# Patient Record
Sex: Female | Born: 1995 | Race: Black or African American | Hispanic: No | Marital: Single | Smoking: Current every day smoker
Health system: Southern US, Community
[De-identification: ages and names within clinical notes are randomized; demographics above are authoritative.]

## PROBLEM LIST (undated history)

## (undated) DIAGNOSIS — J45909 Unspecified asthma, uncomplicated: Secondary | ICD-10-CM

## (undated) DIAGNOSIS — I10 Essential (primary) hypertension: Secondary | ICD-10-CM

## (undated) DIAGNOSIS — D539 Nutritional anemia, unspecified: Secondary | ICD-10-CM

## (undated) DIAGNOSIS — F1990 Other psychoactive substance use, unspecified, uncomplicated: Secondary | ICD-10-CM

## (undated) DIAGNOSIS — K859 Acute pancreatitis without necrosis or infection, unspecified: Secondary | ICD-10-CM

## (undated) HISTORY — DX: Other psychoactive substance use, unspecified, uncomplicated: F19.90

## (undated) HISTORY — PX: NO PAST SURGERIES: SHX2092

---
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
## 2014-01-26 ENCOUNTER — Emergency Department: Payer: Self-pay | Admitting: Emergency Medicine

## 2014-01-26 LAB — COMPREHENSIVE METABOLIC PANEL
ALBUMIN: 4.1 g/dL (ref 3.8–5.6)
ANION GAP: 7 (ref 7–16)
AST: 9 U/L (ref 0–26)
Alkaline Phosphatase: 51 U/L
BILIRUBIN TOTAL: 0.2 mg/dL (ref 0.2–1.0)
BUN: 10 mg/dL (ref 7–18)
CREATININE: 0.88 mg/dL (ref 0.60–1.30)
Calcium, Total: 9.7 mg/dL (ref 9.0–10.7)
Chloride: 106 mmol/L (ref 98–107)
Co2: 23 mmol/L (ref 21–32)
EGFR (African American): >60
Glucose: 96 mg/dL (ref 65–99)
OSMOLALITY: 271 (ref 275–301)
POTASSIUM: 3.7 mmol/L (ref 3.5–5.1)
SGPT (ALT): 16 U/L
Sodium: 136 mmol/L (ref 136–145)
Total Protein: 9.4 g/dL — ABNORMAL HIGH (ref 6.4–8.6)

## 2014-01-26 LAB — URINALYSIS, COMPLETE
Bilirubin,UR: NEGATIVE
Glucose,UR: NEGATIVE mg/dL (ref 0–75)
Ketone: NEGATIVE
LEUKOCYTE ESTERASE: NEGATIVE
Nitrite: NEGATIVE
PH: 5 (ref 4.5–8.0)
Protein: 30
RBC,UR: 2 /HPF (ref 0–5)
Specific Gravity: 1.029 (ref 1.003–1.030)

## 2014-01-26 LAB — CBC WITH DIFFERENTIAL/PLATELET
BASOS ABS: 0.1 10*3/uL (ref 0.0–0.1)
Basophil %: 0.3 %
Eosinophil #: 0.1 10*3/uL (ref 0.0–0.7)
Eosinophil %: 0.4 %
HCT: 49.6 % — ABNORMAL HIGH (ref 35.0–47.0)
HGB: 16.1 g/dL — AB (ref 12.0–16.0)
Lymphocyte #: 1.1 10*3/uL (ref 1.0–3.6)
Lymphocyte %: 6.2 %
MCH: 29.1 pg (ref 26.0–34.0)
MCHC: 32.4 g/dL (ref 32.0–36.0)
MCV: 90 fL (ref 80–100)
MONO ABS: 1 x10 3/mm — AB (ref 0.2–0.9)
MONOS PCT: 6 %
NEUTROS ABS: 14.9 10*3/uL — AB (ref 1.4–6.5)
Neutrophil %: 87.1 %
Platelet: 323 10*3/uL (ref 150–440)
RBC: 5.52 10*6/uL — ABNORMAL HIGH (ref 3.80–5.20)
RDW: 12.8 % (ref 11.5–14.5)
WBC: 17.1 10*3/uL — AB (ref 3.6–11.0)

## 2014-01-26 LAB — LIPASE, BLOOD: Lipase: 103 U/L (ref 73–393)

## 2014-07-29 ENCOUNTER — Emergency Department: Payer: Self-pay | Admitting: Emergency Medicine

## 2014-09-30 NOTE — H&P (Signed)
L&D Evaluation:  History:  HPI [age] y/o G1 2940w3d   Presents with contractions   Patient's Medical History No Chronic Illness   Medications Pre Natal Vitamins   Allergies NKDA   Social History none   Family History Non-Contributory   ROS:  ROS All systems were reviewed.  HEENT, CNS, GI, GU, Respiratory, CV, Renal and Musculoskeletal systems were found to be normal.   Exam:  General no apparent distress   Abdomen gravid, tender with contractions   Estimated Fetal Weight Average for gestational age   Pelvic 4->5 cm   FHT occ'l variable   Ucx regular   Impression:  Impression active labor   Plan:  Comments Admit for GBS prophylaxis and efm and expectant mgt   Electronic Signatures: Margaretha GlassingEvans, Ricky L (MD)  (Signed 01-Apr-14 12:06)  Authored: L&D Evaluation   Last Updated: 01-Apr-14 12:06 by Margaretha GlassingEvans, Ricky L (MD)

## 2015-01-20 ENCOUNTER — Encounter: Payer: Self-pay | Admitting: Emergency Medicine

## 2015-01-20 ENCOUNTER — Emergency Department
Admission: EM | Admit: 2015-01-20 | Discharge: 2015-01-20 | Disposition: A | Discharge status: Home / Self Care | Payer: Medicaid Other | Attending: Student | Admitting: Student

## 2015-01-20 DIAGNOSIS — R509 Fever, unspecified: Secondary | ICD-10-CM | POA: Diagnosis present

## 2015-01-20 DIAGNOSIS — R197 Diarrhea, unspecified: Secondary | ICD-10-CM | POA: Insufficient documentation

## 2015-01-20 DIAGNOSIS — Z3202 Encounter for pregnancy test, result negative: Secondary | ICD-10-CM | POA: Insufficient documentation

## 2015-01-20 DIAGNOSIS — R1030 Lower abdominal pain, unspecified: Secondary | ICD-10-CM | POA: Diagnosis not present

## 2015-01-20 DIAGNOSIS — J069 Acute upper respiratory infection, unspecified: Secondary | ICD-10-CM | POA: Diagnosis not present

## 2015-01-20 LAB — CBC WITH DIFFERENTIAL/PLATELET
Basophils Absolute: 0 10*3/uL (ref 0–0.1)
Basophils Relative: 0 %
EOS PCT: 1 %
Eosinophils Absolute: 0.1 10*3/uL (ref 0–0.7)
HCT: 42.4 % (ref 35.0–47.0)
Hemoglobin: 13.8 g/dL (ref 12.0–16.0)
LYMPHS ABS: 1.5 10*3/uL (ref 1.0–3.6)
LYMPHS PCT: 14 %
MCH: 28.8 pg (ref 26.0–34.0)
MCHC: 32.6 g/dL (ref 32.0–36.0)
MCV: 88.5 fL (ref 80.0–100.0)
MONO ABS: 1 10*3/uL — AB (ref 0.2–0.9)
Monocytes Relative: 9 %
Neutro Abs: 8.5 10*3/uL — ABNORMAL HIGH (ref 1.4–6.5)
Neutrophils Relative %: 76 %
PLATELETS: 290 10*3/uL (ref 150–440)
RBC: 4.79 MIL/uL (ref 3.80–5.20)
RDW: 12.8 % (ref 11.5–14.5)
WBC: 11.1 10*3/uL — ABNORMAL HIGH (ref 3.6–11.0)

## 2015-01-20 LAB — URINALYSIS COMPLETE WITH MICROSCOPIC (ARMC ONLY)
BILIRUBIN URINE: NEGATIVE
GLUCOSE, UA: NEGATIVE mg/dL
Hgb urine dipstick: NEGATIVE
KETONES UR: NEGATIVE mg/dL
Leukocytes, UA: NEGATIVE
Nitrite: NEGATIVE
Protein, ur: NEGATIVE mg/dL
RBC / HPF: NONE SEEN RBC/hpf (ref 0–5)
SPECIFIC GRAVITY, URINE: 1.008 (ref 1.005–1.030)
WBC, UA: NONE SEEN WBC/hpf (ref 0–5)
pH: 7 (ref 5.0–8.0)

## 2015-01-20 MED ORDER — PSEUDOEPHEDRINE HCL 60 MG PO TABS: 60.0000 mg | ORAL_TABLET | ORAL | Status: DC | PRN

## 2015-01-20 MED ORDER — AZITHROMYCIN 250 MG PO TABS: ORAL_TABLET | ORAL | Status: DC

## 2015-01-20 MED ORDER — CHLORPHENIRAMINE MALEATE 4 MG PO TABS: 4.0000 mg | ORAL_TABLET | Freq: Two times a day (BID) | ORAL | Status: DC | PRN

## 2015-01-20 MED ORDER — GUAIFENESIN-CODEINE 100-10 MG/5ML PO SOLN: 10.0000 mL | ORAL | Status: DC | PRN

## 2015-01-20 NOTE — ED Provider Notes (Signed)
[facility] Emergency Department Provider Note  ____________________________________________  Time seen: Approximately 2:45 PM  I have reviewed the triage vital signs and the nursing notes.   HISTORY  Chief Complaint Sore Throat and Facial Pain    HPI Courtney Swanson is a 19 y.o. female presents with complaints of bodyaches chills sore throat fever last 3 days. Patient states loose bowels just today. Decreased appetite. No relief with over-the-counter TheraFlu.   History reviewed. No pertinent past medical history.  There are no active problems to display for this patient.   History reviewed. No pertinent past surgical history.  Current Outpatient Rx  Name  Route  Sig  Dispense  Refill  . azithromycin (ZITHROMAX Z-PAK) 250 MG tablet      Take 2 tablets (500 mg) on  Day 1,  followed by 1 tablet (250 mg) once daily on Days 2 through 5.   6 each   0   . chlorpheniramine (CHLOR-TRIMETON) 4 MG tablet   Oral   Take 1 tablet (4 mg total) by mouth 2 (two) times daily as needed for allergies or rhinitis.   30 tablet   0   . guaiFENesin-codeine 100-10 MG/5ML syrup   Oral   Take 10 mLs by mouth every 4 (four) hours as needed for cough.   180 mL   0   . pseudoephedrine (SUDAFED) 60 MG tablet   Oral   Take 1 tablet (60 mg total) by mouth every 4 (four) hours as needed for congestion.   24 tablet   0     Allergies Review of patient's allergies indicates no known allergies.  No family history on file.  Social History Social History  Substance Use Topics  . Smoking status: Never Smoker   . Smokeless tobacco: None  . Alcohol Use: No    Review of Systems Constitutional: Positive fever and chills Eyes: No visual changes. ENT: No sore throat. Cardiovascular: Denies chest pain. Respiratory: Positive for cough Gastrointestinal: Positive for lower abdominal pain and some diarrhea. Denies any vomiting nausea or constipation. Genitourinary:  Negative for dysuria. Musculoskeletal: Negative for back pain. Skin: Negative for rash. Neurological: Negative for headaches, focal weakness or numbness.  10-point ROS otherwise negative.  ____________________________________________   PHYSICAL EXAM:  VITAL SIGNS: ED Triage Vitals  Enc Vitals Group     BP 01/20/15 1419 117/90 mmHg     Pulse Rate 01/20/15 1419 100     Resp 01/20/15 1419 18     Temp 01/20/15 1419 98.2 F (36.8 C)     Temp Source 01/20/15 1419 Oral     SpO2 01/20/15 1419 96 %     Weight 01/20/15 1419 169 lb (76.658 kg)     Height 01/20/15 1419  (1.702 m)     Head Cir --      Peak Flow --      Pain Score 01/20/15 1428 8     Pain Loc --      Pain Edu? --      Excl. in GC? --     Constitutional: Alert and oriented. Well appearing and in no acute distress. Eyes: Conjunctivae are normal. PERRL. EOMI. Head: Atraumatic. Nose: Positive congestion/rhinorrhea with turbinates mucosal edema. Mouth/Throat: Mucous membranes are moist.  Oropharynx non-erythematous. Neck: No stridor.   Cardiovascular: Normal rate, regular rhythm. Grossly normal heart sounds.  Good peripheral circulation. Respiratory: Normal respiratory effort.  No retractions. Lungs CTAB. Gastrointestinal: Soft and nontender. No distention. No abdominal bruits. No CVA  tenderness. Musculoskeletal: No lower extremity tenderness nor edema.  No joint effusions. Neurologic:  Normal speech and language. No gross focal neurologic deficits are appreciated. No gait instability. Skin:  Skin is warm, dry and intact. No rash noted. Psychiatric: Mood and affect are normal. Speech and behavior are normal.  ____________________________________________   LABS (all labs ordered are listed, but only abnormal results are displayed)  Labs Reviewed  URINALYSIS COMPLETEWITH MICROSCOPIC ([facility] ONLY) - Abnormal; Notable for the following:    Color, Urine STRAW (*)    APPearance CLEAR (*)    Bacteria, UA RARE (*)     Squamous Epithelial / LPF 0-5 (*)    All other components within normal limits  CBC WITH DIFFERENTIAL/PLATELET - Abnormal; Notable for the following:    WBC 11.1 (*)    Neutro Abs 8.5 (*)    Monocytes Absolute 1.0 (*)    All other components within normal limits   ____________________________________________   RADIOLOGY  Deferred ____________________________________________   PROCEDURES  Procedure(s) performed: None  Critical Care performed: No  ____________________________________________   INITIAL IMPRESSION / ASSESSMENT AND PLAN / ED COURSE  Pertinent labs & imaging results that were available during my care of the patient were reviewed by me and considered in my medical decision making (see chart for details).  Acute upper respiratory infection. Rx provided for Z-Pak chlorpheniramine Robitussin-AC. Patient follow-up with PCP or return to the ER as needed.  Patient voices no other emergency medical complaints at this time. ____________________________________________   FINAL CLINICAL IMPRESSION(S) / ED DIAGNOSES  Final diagnoses:  Upper respiratory infection      Evangeline Dakin, PA-C 01/20/15 1624  Sharman Cheek, MD 01/21/15 2219

## 2015-01-20 NOTE — ED Notes (Signed)
Pt presents with sore throat, sinus pain and pressure with some drainage and bodyaches for three days.

## 2015-01-20 NOTE — ED Notes (Signed)
Pt complains of body aches, chills and sore throat  For the last 3 days, pt reports decreased appetite

## 2015-01-20 NOTE — Discharge Instructions (Signed)
Upper Respiratory Infection, Adult An upper respiratory infection (URI) is also sometimes known as the common cold. The upper respiratory tract includes the nose, sinuses, throat, trachea, and bronchi. Bronchi are the airways leading to the lungs. Most people improve within 1 week, but symptoms can last up to 2 weeks. A residual cough may last even longer.  CAUSES Many different viruses can infect the tissues lining the upper respiratory tract. The tissues become irritated and inflamed and often become very moist. Mucus production is also common. A cold is contagious. You can easily spread the virus to others by oral contact. This includes kissing, sharing a glass, coughing, or sneezing. Touching your mouth or nose and then touching a surface, which is then touched by another person, can also spread the virus. SYMPTOMS  Symptoms typically develop 1 to 3 days after you come in contact with a cold virus. Symptoms vary from person to person. They may include:  Runny nose.  Sneezing.  Nasal congestion.  Sinus irritation.  Sore throat.  Loss of voice (laryngitis).  Cough.  Fatigue.  Muscle aches.  Loss of appetite.  Headache.  Low-grade fever. DIAGNOSIS  You might diagnose your own cold based on familiar symptoms, since most people get a cold 2 to 3 times a year. Your caregiver can confirm this based on your exam. Most importantly, your caregiver can check that your symptoms are not due to another disease such as strep throat, sinusitis, pneumonia, asthma, or epiglottitis. Blood tests, throat tests, and X-rays are not necessary to diagnose a common cold, but they may sometimes be helpful in excluding other more serious diseases. Your caregiver will decide if any further tests are required. RISKS AND COMPLICATIONS  You may be at risk for a more severe case of the common cold if you smoke cigarettes, have chronic heart disease (such as heart failure) or lung disease (such as asthma), or if  you have a weakened immune system. The very young and very old are also at risk for more serious infections. Bacterial sinusitis, middle ear infections, and bacterial pneumonia can complicate the common cold. The common cold can worsen asthma and chronic obstructive pulmonary disease (COPD). Sometimes, these complications can require emergency medical care and may be life-threatening. PREVENTION  The best way to protect against getting a cold is to practice good hygiene. Avoid oral or hand contact with people with cold symptoms. Wash your hands often if contact occurs. There is no clear evidence that vitamin C, vitamin E, echinacea, or exercise reduces the chance of developing a cold. However, it is always recommended to get plenty of rest and practice good nutrition. TREATMENT  Treatment is directed at relieving symptoms. There is no cure. Antibiotics are not effective, because the infection is caused by a virus, not by bacteria. Treatment may include:  Increased fluid intake. Sports drinks offer valuable electrolytes, sugars, and fluids.  Breathing heated mist or steam (vaporizer or shower).  Eating chicken soup or other clear broths, and maintaining good nutrition.  Getting plenty of rest.  Using gargles or lozenges for comfort.  Controlling fevers with ibuprofen or acetaminophen as directed by your caregiver.  Increasing usage of your inhaler if you have asthma. Zinc gel and zinc lozenges, taken in the first 24 hours of the common cold, can shorten the duration and lessen the severity of symptoms. Pain medicines may help with fever, muscle aches, and throat pain. A variety of non-prescription medicines are available to treat congestion and runny nose. Your caregiver   can make recommendations and may suggest nasal or lung inhalers for other symptoms.  HOME CARE INSTRUCTIONS   Only take over-the-counter or prescription medicines for pain, discomfort, or fever as directed by your  caregiver.  Use a warm mist humidifier or inhale steam from a shower to increase air moisture. This may keep secretions moist and make it easier to breathe.  Drink enough water and fluids to keep your urine clear or pale yellow.  Rest as needed.  Return to work when your temperature has returned to normal or as your caregiver advises. You may need to stay home longer to avoid infecting others. You can also use a face mask and careful hand washing to prevent spread of the virus. SEEK MEDICAL CARE IF:   After the first few days, you feel you are getting worse rather than better.  You need your caregiver's advice about medicines to control symptoms.  You develop chills, worsening shortness of breath, or brown or red sputum. These may be signs of pneumonia.  You develop yellow or brown nasal discharge or pain in the face, especially when you bend forward. These may be signs of sinusitis.  You develop a fever, swollen neck glands, pain with swallowing, or white areas in the back of your throat. These may be signs of strep throat. SEEK IMMEDIATE MEDICAL CARE IF:   You have a fever.  You develop severe or persistent headache, ear pain, sinus pain, or chest pain.  You develop wheezing, a prolonged cough, cough up blood, or have a change in your usual mucus (if you have chronic lung disease).  You develop sore muscles or a stiff neck. Document Released: 11/02/2000 Document Revised: 08/01/2011 Document Reviewed: 08/14/2013 Exit[employer] Patient Information 2015 [employer]. This information is not intended to replace advice given to you by your health care provider. Make sure you discuss any questions you have with your health care provider.  

## 2015-01-21 LAB — POCT PREGNANCY, URINE: Preg Test, Ur: NEGATIVE

## 2015-06-22 ENCOUNTER — Emergency Department
Admission: EM | Admit: 2015-06-22 | Discharge: 2015-06-23 | Disposition: A | Discharge status: Home / Self Care | Payer: Medicaid Other | Attending: Emergency Medicine | Admitting: Emergency Medicine

## 2015-06-22 DIAGNOSIS — Z3202 Encounter for pregnancy test, result negative: Secondary | ICD-10-CM | POA: Insufficient documentation

## 2015-06-22 DIAGNOSIS — Z880 Allergy status to penicillin: Secondary | ICD-10-CM | POA: Diagnosis not present

## 2015-06-22 DIAGNOSIS — Z792 Long term (current) use of antibiotics: Secondary | ICD-10-CM | POA: Diagnosis not present

## 2015-06-22 DIAGNOSIS — K529 Noninfective gastroenteritis and colitis, unspecified: Secondary | ICD-10-CM | POA: Insufficient documentation

## 2015-06-22 DIAGNOSIS — R1084 Generalized abdominal pain: Secondary | ICD-10-CM | POA: Diagnosis present

## 2015-06-22 LAB — COMPREHENSIVE METABOLIC PANEL
ALBUMIN: 3.9 g/dL (ref 3.5–5.0)
ALK PHOS: 47 U/L (ref 38–126)
ALT: 9 U/L — AB (ref 14–54)
AST: 14 U/L — ABNORMAL LOW (ref 15–41)
Anion gap: 9 (ref 5–15)
BILIRUBIN TOTAL: 0.6 mg/dL (ref 0.3–1.2)
BUN: 9 mg/dL (ref 6–20)
CALCIUM: 9.1 mg/dL (ref 8.9–10.3)
CO2: 24 mmol/L (ref 22–32)
CREATININE: 0.6 mg/dL (ref 0.44–1.00)
Chloride: 104 mmol/L (ref 101–111)
GFR calc Af Amer: >60 mL/min (ref >60)
GFR calc non Af Amer: >60 mL/min (ref >60)
GLUCOSE: 85 mg/dL (ref 65–99)
Potassium: 3.5 mmol/L (ref 3.5–5.1)
SODIUM: 137 mmol/L (ref 135–145)
Total Protein: 8.3 g/dL — ABNORMAL HIGH (ref 6.5–8.1)

## 2015-06-22 LAB — URINALYSIS COMPLETE WITH MICROSCOPIC (ARMC ONLY)
BILIRUBIN URINE: NEGATIVE
Bacteria, UA: NONE SEEN
GLUCOSE, UA: NEGATIVE mg/dL
KETONES UR: NEGATIVE mg/dL
Leukocytes, UA: NEGATIVE
Nitrite: NEGATIVE
PROTEIN: 30 mg/dL — AB
Specific Gravity, Urine: 1.02 (ref 1.005–1.030)
pH: 5 (ref 5.0–8.0)

## 2015-06-22 LAB — CBC
HCT: 44.7 % (ref 35.0–47.0)
Hemoglobin: 15 g/dL (ref 12.0–16.0)
MCH: 30 pg (ref 26.0–34.0)
MCHC: 33.7 g/dL (ref 32.0–36.0)
MCV: 89 fL (ref 80.0–100.0)
PLATELETS: 272 10*3/uL (ref 150–440)
RBC: 5.02 MIL/uL (ref 3.80–5.20)
RDW: 13 % (ref 11.5–14.5)
WBC: 8.9 10*3/uL (ref 3.6–11.0)

## 2015-06-22 LAB — POCT PREGNANCY, URINE: Preg Test, Ur: NEGATIVE

## 2015-06-22 LAB — LIPASE, BLOOD: Lipase: 14 U/L (ref 11–51)

## 2015-06-22 MED ORDER — ONDANSETRON HCL 4 MG/2ML IJ SOLN
4.0000 mg | Freq: Once | INTRAMUSCULAR | Status: AC
  Administered 2015-06-23: 4 mg via INTRAVENOUS
  Filled 2015-06-22: qty 2

## 2015-06-22 MED ORDER — SODIUM CHLORIDE 0.9 % IV BOLUS (SEPSIS)
1000.0000 mL | Freq: Once | INTRAVENOUS | Status: AC
  Administered 2015-06-23: 1000 mL via INTRAVENOUS

## 2015-06-22 MED ORDER — MORPHINE SULFATE (PF) 2 MG/ML IV SOLN
2.0000 mg | Freq: Once | INTRAVENOUS | Status: AC
  Administered 2015-06-23: 2 mg via INTRAVENOUS
  Filled 2015-06-22: qty 1

## 2015-06-22 NOTE — ED Notes (Signed)
Pt arrived to ED with c/o diarrhea and abdominal pain x 1 day. Pt c/o abdominal cramping and "feeling dehydrated".

## 2015-06-23 ENCOUNTER — Emergency Department: Payer: Medicaid Other

## 2015-06-23 MED ORDER — LOPERAMIDE HCL 2 MG PO CAPS
4.0000 mg | ORAL_CAPSULE | Freq: Once | ORAL | Status: AC
  Administered 2015-06-23: 4 mg via ORAL
  Filled 2015-06-23: qty 2

## 2015-06-23 MED ORDER — IOHEXOL 300 MG/ML  SOLN
100.0000 mL | Freq: Once | INTRAMUSCULAR | Status: AC | PRN
  Administered 2015-06-23: 100 mL via INTRAVENOUS

## 2015-06-23 MED ORDER — MORPHINE SULFATE (PF) 2 MG/ML IV SOLN
2.0000 mg | Freq: Once | INTRAVENOUS | Status: AC
  Administered 2015-06-23: 2 mg via INTRAVENOUS
  Filled 2015-06-23: qty 1

## 2015-06-23 MED ORDER — IOHEXOL 240 MG/ML SOLN
25.0000 mL | Freq: Once | INTRAMUSCULAR | Status: AC | PRN
  Administered 2015-06-23: 25 mL via ORAL

## 2015-06-23 MED ORDER — DICYCLOMINE HCL 20 MG PO TABS: 20.0000 mg | ORAL_TABLET | Freq: Three times a day (TID) | ORAL | Status: DC | PRN

## 2015-06-23 NOTE — ED Notes (Signed)
Pt. Going home with family. 

## 2015-06-23 NOTE — Discharge Instructions (Signed)
°  Colitis °Colitis is inflammation of the colon. Colitis may last a short time (acute) or it may last a long time (chronic). °CAUSES °This condition may be caused by: °· Viruses. °· Bacteria. °· Reactions to medicine. °· Certain autoimmune diseases, such as Crohn disease or ulcerative colitis. °SYMPTOMS °Symptoms of this condition include: °· Diarrhea. °· Passing bloody or tarry stool. °· Pain. °· Fever. °· Vomiting. °· Tiredness (fatigue). °· Weight loss. °· Bloating. °· Sudden increase in abdominal pain. °· Having fewer bowel movements than usual. °DIAGNOSIS °This condition is diagnosed with a stool test or a blood test. You may also have other tests, including X-rays, a CT scan, or a colonoscopy. °TREATMENT °Treatment may include: °· Resting the bowel. This involves not eating or drinking for a period of time. °· Fluids that are given through an IV tube. °· Medicine for pain and diarrhea. °· Antibiotic medicines. °· Cortisone medicines. °· Surgery. °HOME CARE INSTRUCTIONS °Eating and Drinking °· Follow instructions from your health care provider about eating or drinking restrictions. °· Drink enough fluid to keep your urine clear or pale yellow. °· Work with a dietitian to determine which foods cause your condition to flare up. °· Avoid foods that cause flare-ups. °· Eat a well-balanced diet. °Medicines °· Take over-the-counter and prescription medicines only as told by your health care provider. °· If you were prescribed an antibiotic medicine, take it as told by your health care provider. Do not stop taking the antibiotic even if you start to feel better. °General Instructions °· Keep all follow-up visits as told by your health care provider. This is important. °SEEK MEDICAL CARE IF: °· Your symptoms do not go away. °· You develop new symptoms. °SEEK IMMEDIATE MEDICAL CARE IF: °· You have a fever that does not go away with treatment. °· You develop chills. °· You have extreme weakness, fainting, or  dehydration. °· You have repeated vomiting. °· You develop severe pain in your abdomen. °· You pass bloody or tarry stool. °  °This information is not intended to replace advice given to you by your health care provider. Make sure you discuss any questions you have with your health care provider. °  °Document Released: 06/16/2004 Document Revised: 01/28/2015 Document Reviewed: 09/01/2014 °Elsevier Interactive Patient Education ©2016 [employer] ° °

## 2015-06-23 NOTE — ED Provider Notes (Signed)
[facility] Emergency Department Provider Note  ____________________________________________  Time seen: 12:20 AM  I have reviewed the triage vital signs and the nursing notes.   HISTORY  Chief Complaint Abdominal Pain    HPI Courtney Swanson is a 20 y.o. female presents with one-day history of generalized abdominal pain and nonbloody diarrhea. Patient also admits to feeling dizzy with positional changes stating I think I'm dehydrated.     Past medical history None There are no active problems to display for this patient.   History reviewed. No pertinent past surgical history.  Current Outpatient Rx  Name  Route  Sig  Dispense  Refill  . azithromycin (ZITHROMAX Z-PAK) 250 MG tablet      Take 2 tablets (500 mg) on  Day 1,  followed by 1 tablet (250 mg) once daily on Days 2 through 5. Patient not taking: Reported on 06/22/2015   6 each   0   . chlorpheniramine (CHLOR-TRIMETON) 4 MG tablet   Oral   Take 1 tablet (4 mg total) by mouth 2 (two) times daily as needed for allergies or rhinitis. Patient not taking: Reported on 06/22/2015   30 tablet   0   . guaiFENesin-codeine 100-10 MG/5ML syrup   Oral   Take 10 mLs by mouth every 4 (four) hours as needed for cough. Patient not taking: Reported on 06/22/2015   180 mL   0   . pseudoephedrine (SUDAFED) 60 MG tablet   Oral   Take 1 tablet (60 mg total) by mouth every 4 (four) hours as needed for congestion. Patient not taking: Reported on 06/22/2015   24 tablet   0     Allergies Amoxicillin and Tylenol  History reviewed. No pertinent family history.  Social History Social History  Substance Use Topics  . Smoking status: Never Smoker   . Smokeless tobacco: None  . Alcohol Use: No    Review of Systems  Constitutional: Negative for fever. Eyes: Negative for visual changes. ENT: Negative for sore throat. Cardiovascular: Negative for chest pain. Respiratory: Negative for shortness of  breath. Gastrointestinal: Positive for abdominal pain and diarrhea. Genitourinary: Negative for dysuria. Musculoskeletal: Negative for back pain. Skin: Negative for rash. Neurological: Negative for headaches, focal weakness or numbness.   10-point ROS otherwise negative.  ____________________________________________   PHYSICAL EXAM:  VITAL SIGNS: ED Triage Vitals  Enc Vitals Group     BP 06/22/15 1944 126/62 mmHg     Pulse Rate 06/22/15 1944 105     Resp 06/22/15 1944 18     Temp 06/22/15 1944 98.4 F (36.9 C)     Temp Source 06/22/15 1944 Oral     SpO2 06/22/15 1944 97 %     Weight 06/22/15 1944 170 lb (77.111 kg)     Height 06/22/15 1944  (1.702 m)     Head Cir --      Peak Flow --      Pain Score 06/22/15 1944 10     Pain Loc --      Pain Edu? --      Excl. in GC? --      Constitutional: Alert and oriented. Well appearing and in no distress. Eyes: Conjunctivae are normal. PERRL. Normal extraocular movements. ENT   Head: Normocephalic and atraumatic.   Nose: No congestion/rhinnorhea.   Mouth/Throat: Mucous membranes are moist.   Neck: No stridor. Hematological/Lymphatic/Immunilogical: No cervical lymphadenopathy. Cardiovascular: Normal rate, regular rhythm. Normal and symmetric distal pulses are present in  all extremities. No murmurs, rubs, or gallops. Respiratory: Normal respiratory effort without tachypnea nor retractions. Breath sounds are clear and equal bilaterally. No wheezes/rales/rhonchi. Gastrointestinal: Generalized abdominal tenderness to palpation worse in the right lower quadrant. No distention. There is no CVA tenderness. Genitourinary: deferred Musculoskeletal: Nontender with normal range of motion in all extremities. No joint effusions.  No lower extremity tenderness nor edema. Neurologic:  Normal speech and language. No gross focal neurologic deficits are appreciated. Speech is normal.  Skin:  Skin is warm, dry and intact. No rash  noted. Psychiatric: Mood and affect are normal. Speech and behavior are normal. Patient exhibits appropriate insight and judgment.  ____________________________________________    LABS (pertinent positives/negatives)  Labs Reviewed  COMPREHENSIVE METABOLIC PANEL - Abnormal; Notable for the following:    Total Protein 8.3 (*)    AST 14 (*)    ALT 9 (*)    All other components within normal limits  URINALYSIS COMPLETEWITH MICROSCOPIC ([facility] ONLY) - Abnormal; Notable for the following:    Color, Urine YELLOW (*)    APPearance HAZY (*)    Hgb urine dipstick 3+ (*)    Protein, ur 30 (*)    Squamous Epithelial / LPF 6-30 (*)    All other components within normal limits  LIPASE, BLOOD  CBC  POC URINE PREG, ED  POCT PREGNANCY, URINE      RADIOLOGY     CT Abdomen Pelvis W Contrast (Final result) Result time: 06/23/15 01:23:30   Final result by Rad Results In Interface (06/23/15 01:23:30)   Narrative:   CLINICAL DATA: Right lower quadrant pain with diarrhea and vomiting. Symptoms for 1 day.  EXAM: CT ABDOMEN AND PELVIS WITH CONTRAST  TECHNIQUE: Multidetector CT imaging of the abdomen and pelvis was performed using the standard protocol following bolus administration of intravenous contrast.  CONTRAST: OMNIPAQUE IOHEXOL 300 MG/ML SOLN  COMPARISON: CT 01/26/2014  FINDINGS: Lower chest: The included lung bases are clear.  Liver: Normal in size, no focal lesion.  Hepatobiliary: Gallbladder physiologically distended, no calcified stone. No biliary dilatation.  Pancreas: Normal.  Spleen: Normal.  Adrenal glands: No nodule.  Kidneys: Symmetric renal enhancement. No hydronephrosis. Incidental note of accessory right renal artery to the lower pole.  Stomach/Bowel: Stomach physiologically distended. There are no dilated or thickened small bowel loops. Pelvic small bowel loops are fluid-filled but normal in caliber. Colon is relatively decompressed,  equivocal colonic wall thickening involving the distal transverse and descending colon, and no surrounding inflammatory change. The appendix is tentatively identified and normal, no pericecal or right lower quadrant inflammatory change.  Vascular/Lymphatic: No retroperitoneal adenopathy. Abdominal aorta is normal in caliber.  Reproductive: Uterus is retroverted. No adnexal mass.  Bladder: Physiologically distended, no wall thickening.  Other: No free air, free fluid, or intra-abdominal fluid collection.  Musculoskeletal: There are no acute or suspicious osseous abnormalities.  IMPRESSION: 1. Colon is nondistended, equivocal colonic wall thickening involving the distal transverse and descending. This is likely within normal limits, however given history of diarrhea, mild colitis could have this appearance. 2. Otherwise no acute abnormality in the abdomen/pelvis.   Electronically Signed By: Rubye Oaks M.D. On: 06/23/2015 01:23              INITIAL IMPRESSION / ASSESSMENT AND PLAN / ED COURSE  Pertinent labs & imaging results that were available during my care of the patient were reviewed by me and considered in my medical decision making (see chart for details).  I returned to the room  to inform the patient of the CT findings. Patient eating [employer] chicken sandwich and tolerating it well. No vomiting. I informed the patient of the Ct findings and encourage followup with Dr Mechele Collin in 2 days if symptoms not resolved given unlikely possibility of inflammatory colitis  ____________________________________________   FINAL CLINICAL IMPRESSION(S) / ED DIAGNOSES  Final diagnoses:  Colitis      Darci Current, MD 06/23/15 [phone]

## 2015-09-08 ENCOUNTER — Emergency Department
Admission: EM | Admit: 2015-09-08 | Discharge: 2015-09-08 | Disposition: A | Discharge status: Home / Self Care | Payer: Medicaid Other | Attending: Emergency Medicine | Admitting: Emergency Medicine

## 2015-09-08 DIAGNOSIS — Z9109 Other allergy status, other than to drugs and biological substances: Secondary | ICD-10-CM

## 2015-09-08 DIAGNOSIS — F17210 Nicotine dependence, cigarettes, uncomplicated: Secondary | ICD-10-CM | POA: Insufficient documentation

## 2015-09-08 DIAGNOSIS — J301 Allergic rhinitis due to pollen: Secondary | ICD-10-CM | POA: Diagnosis not present

## 2015-09-08 DIAGNOSIS — R0981 Nasal congestion: Secondary | ICD-10-CM | POA: Diagnosis present

## 2015-09-08 HISTORY — DX: Unspecified asthma, uncomplicated: J45.909

## 2015-09-08 MED ORDER — ALBUTEROL SULFATE HFA 108 (90 BASE) MCG/ACT IN AERS: 2.0000 | INHALATION_SPRAY | Freq: Four times a day (QID) | RESPIRATORY_TRACT | Status: DC | PRN

## 2015-09-08 MED ORDER — IPRATROPIUM-ALBUTEROL 0.5-2.5 (3) MG/3ML IN SOLN
3.0000 mL | Freq: Once | RESPIRATORY_TRACT | Status: AC
  Administered 2015-09-08: 3 mL via RESPIRATORY_TRACT
  Filled 2015-09-08: qty 3

## 2015-09-08 MED ORDER — CHLORPHENIRAMINE MALEATE 4 MG PO TABS: 4.0000 mg | ORAL_TABLET | Freq: Two times a day (BID) | ORAL | Status: DC | PRN

## 2015-09-08 MED ORDER — PSEUDOEPHEDRINE HCL 60 MG PO TABS: 60.0000 mg | ORAL_TABLET | ORAL | Status: DC | PRN

## 2015-09-08 MED ORDER — GUAIFENESIN-CODEINE 100-10 MG/5ML PO SOLN: 10.0000 mL | ORAL | Status: DC | PRN

## 2015-09-08 MED ORDER — FLUTICASONE PROPIONATE 50 MCG/ACT NA SUSP: 1.0000 | Freq: Every day | NASAL | Status: DC

## 2015-09-08 NOTE — ED Notes (Signed)
Pt c/o allergies, for the past month, having nasal congestion and cough. States she has been taking OTC allergy meds without relief..Marland Kitchen

## 2015-09-08 NOTE — Discharge Instructions (Signed)

## 2015-09-08 NOTE — ED Provider Notes (Signed)
[facility] Emergency Department Provider Note  ____________________________________________  Time seen: Approximately 1:47 PM  I have reviewed the triage vital signs and the nursing notes.   HISTORY  Chief Complaint Nasal Congestion    HPI Courtney Swanson is a 20 y.o. female presents with a one-month history of allergies runny nose, congestion, cough, bodyaches not feeling well. Patient states she's been taking over-the-counter medications without relief. Patient states symptoms started with the pollen started falling. Has a past medical history of allergies and is requesting a breathing treatment to help open up her chest. She feels like this whenever she takes a deep breath in her chest is feeling tight.   Past Medical History  Diagnosis Date  . Asthma     There are no active problems to display for this patient.   History reviewed. No pertinent past surgical history.  Current Outpatient Rx  Name  Route  Sig  Dispense  Refill  . albuterol (PROVENTIL HFA;VENTOLIN HFA) 108 (90 Base) MCG/ACT inhaler   Inhalation   Inhale 2 puffs into the lungs every 6 (six) hours as needed for wheezing or shortness of breath.   1 Inhaler   2   . chlorpheniramine (CHLOR-TRIMETON) 4 MG tablet   Oral   Take 1 tablet (4 mg total) by mouth 2 (two) times daily as needed for allergies or rhinitis.   30 tablet   0   . fluticasone (FLONASE) 50 MCG/ACT nasal spray   Each Nare   Place 1 spray into both nostrils daily.   16 g   2   . guaiFENesin-codeine 100-10 MG/5ML syrup   Oral   Take 10 mLs by mouth every 4 (four) hours as needed for cough.   180 mL   0   . pseudoephedrine (SUDAFED) 60 MG tablet   Oral   Take 1 tablet (60 mg total) by mouth every 4 (four) hours as needed for congestion.   24 tablet   0     Allergies Amoxicillin and Tylenol  No family history on file.  Social History Social History  Substance Use Topics  . Smoking status: Current  Every Day Smoker    Types: Cigarettes  . Smokeless tobacco: None  . Alcohol Use: No    Review of Systems Constitutional: No fever/chills Eyes: No visual changes. ENT: Positive for sore throat, positive for nasal congestion and drainage. Cardiovascular: Denies chest pain. Feels a chest tightness. Respiratory: Denies shortness of breath. Positive for cough  Genitourinary: Negative for dysuria. Musculoskeletal: Negative for back pain. Skin: Negative for rash. Neurological: Negative for headaches, focal weakness or numbness.  10-point ROS otherwise negative.  ____________________________________________   PHYSICAL EXAM:  VITAL SIGNS: ED Triage Vitals  Enc Vitals Group     BP 09/08/15 1316 124/71 mmHg     Pulse Rate 09/08/15 1316 98     Resp 09/08/15 1316 20     Temp 09/08/15 1316 98.6 F (37 C)     Temp Source 09/08/15 1316 Oral     SpO2 09/08/15 1316 97 %     Weight 09/08/15 1316 165 lb (74.844 kg)     Height 09/08/15 1316 5\' 7"  (1.702 m)     Head Cir --      Peak Flow --      Pain Score 09/08/15 1316 10     Pain Loc --      Pain Edu? --      Excl. in GC? --  Constitutional: Alert and oriented. Well appearing and in no acute distress. Eyes: Conjunctivae are normal. PERRL. EOMI. Head: Atraumatic. Nose: Bilateral anterior turbinate swelling and mucosal edema noted Mouth/Throat: Mucous membranes are moist.  Oropharynx non-erythematous. Neck: No stridor.  Full range of motion nontender Cardiovascular: Normal rate, regular rhythm. Grossly normal heart sounds.  Good peripheral circulation. Respiratory: Normal respiratory effort.  No retractions. Lungs CTAB. Musculoskeletal: No lower extremity tenderness nor edema.  No joint effusions. Neurologic:  Normal speech and language. No gross focal neurologic deficits are appreciated. No gait instability. Skin:  Skin is warm, dry and intact. No rash noted. Psychiatric: Mood and affect are normal. Speech and behavior are  normal.  ____________________________________________   LABS (all labs ordered are listed, but only abnormal results are displayed)  Labs Reviewed - No data to display ____________________________________________   PROCEDURES  Procedure(s) performed: None  Critical Care performed: No  ____________________________________________   INITIAL IMPRESSION / ASSESSMENT AND PLAN / ED COURSE  Pertinent labs & imaging results that were available during my care of the patient were reviewed by me and considered in my medical decision making (see chart for details).  DuoNeb treatment given on ED. Patient discharged home with prescription for chlorpheniramine, Robitussin-AC, Sudafed. Patient to follow-up with her PCP or return to the ER with any change. Patient voices no other emergency medical complaints at this time. ____________________________________________   FINAL CLINICAL IMPRESSION(S) / ED DIAGNOSES  Final diagnoses:  Pollen allergies     This chart was dictated using voice recognition software/Dragon. Despite best efforts to proofread, errors can occur which can change the meaning. Any change was purely unintentional.   Evangeline Dakin, PA-C 09/08/15 1510  Myrna Blazer, MD 09/08/15 1630

## 2015-10-31 ENCOUNTER — Encounter: Payer: Self-pay | Admitting: Emergency Medicine

## 2015-10-31 ENCOUNTER — Emergency Department
Admission: EM | Admit: 2015-10-31 | Discharge: 2015-10-31 | Disposition: A | Discharge status: Home / Self Care | Payer: Medicaid Other | Attending: Emergency Medicine | Admitting: Emergency Medicine

## 2015-10-31 DIAGNOSIS — F17210 Nicotine dependence, cigarettes, uncomplicated: Secondary | ICD-10-CM | POA: Insufficient documentation

## 2015-10-31 DIAGNOSIS — J069 Acute upper respiratory infection, unspecified: Secondary | ICD-10-CM

## 2015-10-31 DIAGNOSIS — Z79899 Other long term (current) drug therapy: Secondary | ICD-10-CM | POA: Insufficient documentation

## 2015-10-31 DIAGNOSIS — J45909 Unspecified asthma, uncomplicated: Secondary | ICD-10-CM | POA: Insufficient documentation

## 2015-10-31 DIAGNOSIS — J029 Acute pharyngitis, unspecified: Secondary | ICD-10-CM | POA: Diagnosis present

## 2015-10-31 MED ORDER — IBUPROFEN 600 MG PO TABS: 600.0000 mg | ORAL_TABLET | Freq: Three times a day (TID) | ORAL | Status: DC | PRN

## 2015-10-31 MED ORDER — ONDANSETRON HCL 4 MG PO TABS: 4.0000 mg | ORAL_TABLET | Freq: Three times a day (TID) | ORAL | Status: AC | PRN

## 2015-10-31 MED ORDER — KETOROLAC TROMETHAMINE 60 MG/2ML IM SOLN
60.0000 mg | Freq: Once | INTRAMUSCULAR | Status: AC
  Administered 2015-10-31: 60 mg via INTRAMUSCULAR
  Filled 2015-10-31: qty 2

## 2015-10-31 MED ORDER — LIDOCAINE VISCOUS 2 % MT SOLN
15.0000 mL | Freq: Once | OROMUCOSAL | Status: AC
  Administered 2015-10-31: 15 mL via OROMUCOSAL
  Filled 2015-10-31: qty 15

## 2015-10-31 MED ORDER — BENZONATATE 100 MG PO CAPS
200.0000 mg | ORAL_CAPSULE | Freq: Once | ORAL | Status: AC
  Administered 2015-10-31: 200 mg via ORAL
  Filled 2015-10-31: qty 2

## 2015-10-31 MED ORDER — LIDOCAINE VISCOUS 2 % MT SOLN
5.0000 mL | Freq: Four times a day (QID) | OROMUCOSAL | Status: DC | PRN
Start: 2015-10-31 — End: 2021-09-21

## 2015-10-31 MED ORDER — DIPHENHYDRAMINE HCL 12.5 MG/5ML PO ELIX
25.0000 mg | ORAL_SOLUTION | Freq: Once | ORAL | Status: AC
  Administered 2015-10-31: 25 mg via ORAL
  Filled 2015-10-31: qty 10

## 2015-10-31 MED ORDER — ONDANSETRON 8 MG PO TBDP
8.0000 mg | ORAL_TABLET | Freq: Once | ORAL | Status: AC
  Administered 2015-10-31: 8 mg via ORAL
  Filled 2015-10-31: qty 1

## 2015-10-31 MED ORDER — PSEUDOEPH-BROMPHEN-DM 30-2-10 MG/5ML PO SYRP
5.0000 mL | ORAL_SOLUTION | Freq: Four times a day (QID) | ORAL | Status: DC | PRN
Start: 2015-10-31 — End: 2017-01-23

## 2015-10-31 NOTE — Discharge Instructions (Signed)

## 2015-10-31 NOTE — ED Provider Notes (Signed)
[facility] Emergency Department Provider Note   ____________________________________________  Time seen: Approximately 5:18 PM  I have reviewed the triage vital signs and the nursing notes.   HISTORY  Chief Complaint Cough     HPI Courtney Swanson is a 20 y.o. female pacer percent of acute onset of nasal congestion, postnasal drainage sore throat, and productive cough. Patient also complaining of body aches and frontal headache.Patient stated this fever and chills. Patient also states nausea and vomiting. No palliative measures taken for this complaint.   Past Medical History  Diagnosis Date  . Asthma     There are no active problems to display for this patient.   History reviewed. No pertinent past surgical history.  Current Outpatient Rx  Name  Route  Sig  Dispense  Refill  . albuterol (PROVENTIL HFA;VENTOLIN HFA) 108 (90 Base) MCG/ACT inhaler   Inhalation   Inhale 2 puffs into the lungs every 6 (six) hours as needed for wheezing or shortness of breath.   1 Inhaler   2   . brompheniramine-pseudoephedrine-DM 30-2-10 MG/5ML syrup   Oral   Take 5 mLs by mouth 4 (four) times daily as needed. X-rays with 5 mL of viscous lidocaine swish and swallow.   120 mL   0   . chlorpheniramine (CHLOR-TRIMETON) 4 MG tablet   Oral   Take 1 tablet (4 mg total) by mouth 2 (two) times daily as needed for allergies or rhinitis.   30 tablet   0   . fluticasone (FLONASE) 50 MCG/ACT nasal spray   Each Nare   Place 1 spray into both nostrils daily.   16 g   2   . guaiFENesin-codeine 100-10 MG/5ML syrup   Oral   Take 10 mLs by mouth every 4 (four) hours as needed for cough.   180 mL   0   . ibuprofen (ADVIL,MOTRIN) 600 MG tablet   Oral   Take 1 tablet (600 mg total) by mouth every 8 (eight) hours as needed.   15 tablet   0   . lidocaine (XYLOCAINE) 2 % solution   Mouth/Throat   Use as directed 5 mLs in the mouth or throat every 6 (six) hours as  needed for mouth pain. 5 mL (felt DM for swish and swallow.   100 mL   0   . ondansetron (ZOFRAN) 4 MG tablet   Oral   Take 1 tablet (4 mg total) by mouth every 8 (eight) hours as needed for nausea or vomiting.   15 tablet   1   . pseudoephedrine (SUDAFED) 60 MG tablet   Oral   Take 1 tablet (60 mg total) by mouth every 4 (four) hours as needed for congestion.   24 tablet   0     Allergies Amoxicillin and Tylenol  No family history on file.  Social History Social History  Substance Use Topics  . Smoking status: Current Every Day Smoker    Types: Cigarettes  . Smokeless tobacco: None  . Alcohol Use: No    Review of Systems Constitutional: Fever and chills. Mild joint Eyes: No visual changes. ENT: Sore throat  Cardiovascular: Denies chest pain. Respiratory: Denies shortness of breath. Gastrointestinal: No abdominal pain.  Nausea and vomiting.  No diarrhea.  No constipation. Genitourinary: Negative for dysuria. Musculoskeletal: Negative for back pain. Skin: Negative for rash. Neurological: Negative for headaches, focal weakness or numbness. Allergic/Immunilogical: Amoxicillin and Tylenol.    ____________________________________________   PHYSICAL EXAM:  VITAL  SIGNS: ED Triage Vitals  Enc Vitals Group     BP 10/31/15 1644 111/66 mmHg     Pulse Rate 10/31/15 1644 94     Resp 10/31/15 1644 20     Temp 10/31/15 1644 98.8 F (37.1 C)     Temp Source 10/31/15 1644 Oral     SpO2 10/31/15 1644 97 %     Weight 10/31/15 1644 165 lb (74.844 kg)     Height 10/31/15 1644  (1.702 m)     Head Cir --      Peak Flow --      Pain Score 10/31/15 1643 8     Pain Loc --      Pain Edu? --      Excl. in GC? --     Constitutional: Alert and oriented. Well appearing and in no acute distress. Eyes: Conjunctivae are normal. PERRL. EOMI. Head: Atraumatic. Nose: Bilateral maxillary guarding. Edematous nasal turbinates.  Mouth/Throat: Mucous membranes are moist.   Oropharynx erythematous. Tonsils are not exudative Neck: No stridor.  No cervical spine tenderness to palpation. Hematological/Lymphatic/Immunilogical: No cervical lymphadenopathy. Cardiovascular: Normal rate, regular rhythm. Grossly normal heart sounds.  Good peripheral circulation. Respiratory: Normal respiratory effort.  No retractions. Lungs CTAB. Nonproductive cough Gastrointestinal: Soft and nontender. No distention. No abdominal bruits. No CVA tenderness. Musculoskeletal: No lower extremity tenderness nor edema.  No joint effusions. Neurologic:  Normal speech and language. No gross focal neurologic deficits are appreciated. No gait instability. Skin:  Skin is warm, dry and intact. No rash noted. Psychiatric: Mood and affect are normal. Speech and behavior are normal.  ____________________________________________   LABS (all labs ordered are listed, but only abnormal results are displayed)  Labs Reviewed - No data to display ____________________________________________  EKG   ____________________________________________  RADIOLOGY   ____________________________________________   PROCEDURES  Procedure(s) performed: None  Critical Care performed: No  ____________________________________________   INITIAL IMPRESSION / ASSESSMENT AND PLAN / ED COURSE  Pertinent labs & imaging results that were available during my care of the patient were reviewed by me and considered in my medical decision making (see chart for details).  Viral upper rest or infection. Patient given discharge care instructions. Patient given a prescription for Bromfed-DM, viscous lidocaine, Zofran, and ibuprofen. She didn't work note. ____________________________________________   FINAL CLINICAL IMPRESSION(S) / ED DIAGNOSES  Final diagnoses:  Viral upper respiratory illness      NEW MEDICATIONS STARTED DURING THIS VISIT:  New Prescriptions   BROMPHENIRAMINE-PSEUDOEPHEDRINE-DM 30-2-10  MG/5ML SYRUP    Take 5 mLs by mouth 4 (four) times daily as needed. X-rays with 5 mL of viscous lidocaine swish and swallow.   IBUPROFEN (ADVIL,MOTRIN) 600 MG TABLET    Take 1 tablet (600 mg total) by mouth every 8 (eight) hours as needed.   LIDOCAINE (XYLOCAINE) 2 % SOLUTION    Use as directed 5 mLs in the mouth or throat every 6 (six) hours as needed for mouth pain. 5 mL (felt DM for swish and swallow.   ONDANSETRON (ZOFRAN) 4 MG TABLET    Take 1 tablet (4 mg total) by mouth every 8 (eight) hours as needed for nausea or vomiting.     Note:  This document was prepared using Dragon voice recognition software and may include unintentional dictation errors.    Joni Reining, PA-C 10/31/15 1737  Joni Reining, PA-C 10/31/15 1739  Sharyn Creamer, MD 11/01/15 [phone]

## 2015-10-31 NOTE — ED Notes (Signed)
States nasal /chest congestion with prod cough also possible fever/chill   Body aches   Sore throat since yesterday

## 2015-11-14 ENCOUNTER — Emergency Department: Payer: Medicaid Other

## 2015-11-14 ENCOUNTER — Emergency Department
Admission: EM | Admit: 2015-11-14 | Discharge: 2015-11-14 | Disposition: A | Discharge status: Home / Self Care | Payer: Medicaid Other | Attending: Emergency Medicine | Admitting: Emergency Medicine

## 2015-11-14 DIAGNOSIS — F17210 Nicotine dependence, cigarettes, uncomplicated: Secondary | ICD-10-CM | POA: Diagnosis not present

## 2015-11-14 DIAGNOSIS — J45909 Unspecified asthma, uncomplicated: Secondary | ICD-10-CM | POA: Diagnosis not present

## 2015-11-14 DIAGNOSIS — Z5321 Procedure and treatment not carried out due to patient leaving prior to being seen by health care provider: Secondary | ICD-10-CM | POA: Diagnosis not present

## 2015-11-14 DIAGNOSIS — R091 Pleurisy: Secondary | ICD-10-CM | POA: Diagnosis present

## 2015-11-14 NOTE — ED Notes (Signed)
Patient reports having pain under her right breast.  States pain increases with deep breathing and movement.  Reports came tonight because left arm became numb.  Reports she was recently treated for URI.  Reports continued productive cough (brown sputum).

## 2016-02-01 ENCOUNTER — Encounter: Payer: Self-pay | Admitting: Emergency Medicine

## 2016-02-01 ENCOUNTER — Emergency Department
Admission: EM | Admit: 2016-02-01 | Discharge: 2016-02-01 | Disposition: A | Discharge status: Home / Self Care | Payer: Medicaid Other | Attending: Emergency Medicine | Admitting: Emergency Medicine

## 2016-02-01 DIAGNOSIS — R51 Headache: Secondary | ICD-10-CM | POA: Diagnosis not present

## 2016-02-01 DIAGNOSIS — R112 Nausea with vomiting, unspecified: Secondary | ICD-10-CM | POA: Insufficient documentation

## 2016-02-01 DIAGNOSIS — F17210 Nicotine dependence, cigarettes, uncomplicated: Secondary | ICD-10-CM | POA: Diagnosis not present

## 2016-02-01 DIAGNOSIS — B9789 Other viral agents as the cause of diseases classified elsewhere: Secondary | ICD-10-CM

## 2016-02-01 DIAGNOSIS — J45909 Unspecified asthma, uncomplicated: Secondary | ICD-10-CM | POA: Diagnosis not present

## 2016-02-01 DIAGNOSIS — R109 Unspecified abdominal pain: Secondary | ICD-10-CM | POA: Diagnosis not present

## 2016-02-01 DIAGNOSIS — J069 Acute upper respiratory infection, unspecified: Secondary | ICD-10-CM | POA: Diagnosis not present

## 2016-02-01 DIAGNOSIS — R509 Fever, unspecified: Secondary | ICD-10-CM | POA: Diagnosis present

## 2016-02-01 LAB — COMPREHENSIVE METABOLIC PANEL
ALBUMIN: 3.4 g/dL — AB (ref 3.5–5.0)
ALT: 10 U/L — ABNORMAL LOW (ref 14–54)
AST: 17 U/L (ref 15–41)
Alkaline Phosphatase: 35 U/L — ABNORMAL LOW (ref 38–126)
Anion gap: 6 (ref 5–15)
BUN: 5 mg/dL — AB (ref 6–20)
CHLORIDE: 107 mmol/L (ref 101–111)
CO2: 23 mmol/L (ref 22–32)
Calcium: 8.6 mg/dL — ABNORMAL LOW (ref 8.9–10.3)
Creatinine, Ser: 0.73 mg/dL (ref 0.44–1.00)
GFR calc Af Amer: >60 mL/min (ref >60)
GFR calc non Af Amer: >60 mL/min (ref >60)
GLUCOSE: 98 mg/dL (ref 65–99)
POTASSIUM: 3.6 mmol/L (ref 3.5–5.1)
SODIUM: 136 mmol/L (ref 135–145)
Total Bilirubin: <0.1 mg/dL — ABNORMAL LOW (ref 0.3–1.2)
Total Protein: 7.2 g/dL (ref 6.5–8.1)

## 2016-02-01 LAB — CBC
HEMATOCRIT: 39.3 % (ref 35.0–47.0)
HEMOGLOBIN: 13.3 g/dL (ref 12.0–16.0)
MCH: 29.9 pg (ref 26.0–34.0)
MCHC: 33.9 g/dL (ref 32.0–36.0)
MCV: 88.4 fL (ref 80.0–100.0)
Platelets: 265 10*3/uL (ref 150–440)
RBC: 4.44 MIL/uL (ref 3.80–5.20)
RDW: 12.5 % (ref 11.5–14.5)
WBC: 11.8 10*3/uL — ABNORMAL HIGH (ref 3.6–11.0)

## 2016-02-01 LAB — URINALYSIS COMPLETE WITH MICROSCOPIC (ARMC ONLY)
BACTERIA UA: NONE SEEN
Bilirubin Urine: NEGATIVE
GLUCOSE, UA: NEGATIVE mg/dL
HGB URINE DIPSTICK: NEGATIVE
Ketones, ur: NEGATIVE mg/dL
Leukocytes, UA: NEGATIVE
Nitrite: NEGATIVE
PROTEIN: NEGATIVE mg/dL
Specific Gravity, Urine: 1.01 (ref 1.005–1.030)
pH: 7 (ref 5.0–8.0)

## 2016-02-01 LAB — POCT PREGNANCY, URINE: PREG TEST UR: NEGATIVE

## 2016-02-01 LAB — LIPASE, BLOOD: LIPASE: 14 U/L (ref 11–51)

## 2016-02-01 MED ORDER — ONDANSETRON 4 MG PO TBDP: 4.0000 mg | ORAL_TABLET | Freq: Three times a day (TID) | ORAL | 0 refills | Status: DC | PRN

## 2016-02-01 MED ORDER — METOCLOPRAMIDE HCL 10 MG PO TABS
5.0000 mg | ORAL_TABLET | Freq: Once | ORAL | Status: AC
  Administered 2016-02-01: 5 mg via ORAL
  Filled 2016-02-01: qty 1

## 2016-02-01 MED ORDER — METOCLOPRAMIDE HCL 5 MG PO TABS: 5.0000 mg | ORAL_TABLET | Freq: Three times a day (TID) | ORAL | 1 refills | Status: DC

## 2016-02-01 NOTE — ED Provider Notes (Signed)
[facility] Emergency Department Provider Note   ____________________________________________   First MD Initiated Contact with Patient 02/01/16 1348     (approximate)  I have reviewed the triage vital signs and the nursing notes.   HISTORY  Chief Complaint Abdominal Pain; Nasal Congestion; and Emesis    HPI Courtney Swanson is a 20 y.o. female who presents with fever, congestive symptoms, abdominal pain, and vomiting. Fevers and congestive symptoms began Friday and worsened the following day. Patient reports runny nose, sore throat, cough productive of brown sputum, SOB, and chest pain. Patient has a history of asthma and has been using albuterol nebulizer 2-3x per day to relieve chest tightness. Patient did not check her own temperature at home, but has been experiencing sweats/chills, body aches, lack of appetite, and fatigue. Has been experiencing abdominal discomfort and emesis since yesterday, threw up both times she tried to eat. Son is here with her and also has fever, congestive symptoms and abdominal pain. No other sick contacts at home or elsewhere. Also experiencing HA, dizziness, lightheadedness. Denies urinary symptoms, diarrhea, palpitations, numbness or tingling.   Past Medical History:  Diagnosis Date  . Asthma     There are no active problems to display for this patient.   History reviewed. No pertinent surgical history.  Prior to Admission medications   Medication Sig Start Date End Date Taking? Authorizing Provider  albuterol (PROVENTIL HFA;VENTOLIN HFA) 108 (90 Base) MCG/ACT inhaler Inhale 2 puffs into the lungs every 6 (six) hours as needed for wheezing or shortness of breath. 09/08/15   Evangeline Dakinharles M Beers, PA-C  brompheniramine-pseudoephedrine-DM 30-2-10 MG/5ML syrup Take 5 mLs by mouth 4 (four) times daily as needed. X-rays with 5 mL of viscous lidocaine swish and swallow. 10/31/15   Joni Reiningonald K Smith, PA-C  chlorpheniramine  (CHLOR-TRIMETON) 4 MG tablet Take 1 tablet (4 mg total) by mouth 2 (two) times daily as needed for allergies or rhinitis. 09/08/15   Charmayne Sheerharles M Beers, PA-C  fluticasone (FLONASE) 50 MCG/ACT nasal spray Place 1 spray into both nostrils daily. 09/08/15 09/07/16  Charmayne Sheerharles M Beers, PA-C  guaiFENesin-codeine 100-10 MG/5ML syrup Take 10 mLs by mouth every 4 (four) hours as needed for cough. 09/08/15   Charmayne Sheerharles M Beers, PA-C  ibuprofen (ADVIL,MOTRIN) 600 MG tablet Take 1 tablet (600 mg total) by mouth every 8 (eight) hours as needed. 10/31/15   Joni Reiningonald K Smith, PA-C  lidocaine (XYLOCAINE) 2 % solution Use as directed 5 mLs in the mouth or throat every 6 (six) hours as needed for mouth pain. 5 mL (felt DM for swish and swallow. 10/31/15   Joni Reiningonald K Smith, PA-C  metoCLOPramide (REGLAN) 5 MG tablet Take 1 tablet (5 mg total) by mouth 3 (three) times daily. 02/01/16 01/31/17  Tommi Rumpshonda L Bertha Earwood, PA-C  ondansetron (ZOFRAN) 4 MG tablet Take 1 tablet (4 mg total) by mouth every 8 (eight) hours as needed for nausea or vomiting. 10/31/15 10/30/16  Joni Reiningonald K Smith, PA-C  pseudoephedrine (SUDAFED) 60 MG tablet Take 1 tablet (60 mg total) by mouth every 4 (four) hours as needed for congestion. 09/08/15   Evangeline Dakinharles M Beers, PA-C    Allergies Amoxicillin and Tylenol [acetaminophen]  History reviewed. No pertinent family history.  Social History Social History  Substance Use Topics  . Smoking status: Current Every Day Smoker    Types: Cigarettes  . Smokeless tobacco: Never Used  . Alcohol use No    Review of Systems Constitutional: Positive for fever, chills, fatigue.  Eyes:  No visual changes. ENT: Positive for sore throat, runny nose, ear fullness.  Cardiovascular: Positive for chest discomfort.  Respiratory: Positive for SOB, productive cough.  Gastrointestinal: Positive for abdominal pain, nausea and vomiting. Negative for diarrhea.  Genitourinary: Negative for dysuria. Musculoskeletal: Positive for myalgias.  Skin:  Negative for rash. Neurological: Positive for headaches, dizziness, lightheadedness. Negative for focal weakness or numbness. 10-point ROS otherwise negative.  ____________________________________________   PHYSICAL EXAM:  VITAL SIGNS: ED Triage Vitals  Enc Vitals Group     BP 02/01/16 1339 118/75     Pulse Rate 02/01/16 1339 81     Resp 02/01/16 1339 18     Temp 02/01/16 1339 98.2 F (36.8 C)     Temp Source 02/01/16 1339 Oral     SpO2 02/01/16 1339 96 %     Weight 02/01/16 1340 150 lb (68 kg)     Height 02/01/16 1340 5\' 7"  (1.702 m)     Head Circumference --      Peak Flow --      Pain Score 02/01/16 1340 10     Pain Loc --      Pain Edu? --      Excl. in GC? --     Constitutional: Alert and oriented. Sickly appearing and in no acute distress. Eyes: Conjunctivae are normal. PERRL. EOMI. Head: Atraumatic. Nose: Congestion present and turbinates mildly swollen and erythematous bilaterally.  Mouth/Throat: Mucous membranes are moist.  Oropharynx mildly erythematous without exudate.  Neck: No stridor. No cervical spine tenderness to palpation. Hematological/Lymphatic/Immunilogical: No cervical lymphadenopathy. Cardiovascular: Normal rate, regular rhythm. Grossly normal heart sounds.  Good peripheral circulation. Fingers acyanotic and warm with brisk capillary refill.  Respiratory: Normal respiratory effort.  No retractions. Lungs CTAB without wheezing, rales or rhonchi.  Gastrointestinal: Soft and nondistended. Mildly TTP over suprapubic region. Normoactive bowel sounds present in all quadrants. No rebound tenderness or guarding.  Genitourinary: Exam deferred.  Musculoskeletal: Full ROM in all extremities without pain or difficulty.  Neurologic:  Normal speech and language. No gross focal neurologic deficits are appreciated.  Skin:  Skin is warm, dry and intact. No rash noted. Psychiatric: Mood and affect are normal. Speech and behavior are  normal.  ____________________________________________   LABS (all labs ordered are listed, but only abnormal results are displayed)  Labs Reviewed  COMPREHENSIVE METABOLIC PANEL - Abnormal; Notable for the following:       Result Value   BUN 5 (*)    Calcium 8.6 (*)    Albumin 3.4 (*)    ALT 10 (*)    Alkaline Phosphatase 35 (*)    Total Bilirubin <0.1 (*)    All other components within normal limits  CBC - Abnormal; Notable for the following:    WBC 11.8 (*)    All other components within normal limits  URINALYSIS COMPLETEWITH MICROSCOPIC ([facility] ONLY) - Abnormal; Notable for the following:    Color, Urine YELLOW (*)    APPearance CLEAR (*)    Squamous Epithelial / LPF 6-30 (*)    All other components within normal limits  LIPASE, BLOOD  POC URINE PREG, ED  POCT PREGNANCY, URINE    PROCEDURES  Procedure(s) performed: None  Procedures  Critical Care performed: No  ____________________________________________   INITIAL IMPRESSION / ASSESSMENT AND PLAN / ED COURSE  Pertinent labs & imaging results that were available during my care of the patient were reviewed by me and considered in my medical decision making (see chart for details).  Patient  symptoms most likely related to viral illness. Given prescription for Reglan to help with nausea. Encouraged to get plenty of rest and fluids. Instructed on supportive care and symptom management. Instructed to follow up with PCP if no improvement in the next few days or symptoms worsen. No other emergency medicine complaints at this time.   Clinical Course     ____________________________________________   FINAL CLINICAL IMPRESSION(S) / ED DIAGNOSES  Final diagnoses:  Viral URI with cough  Non-intractable vomiting with nausea, vomiting of unspecified type      NEW MEDICATIONS STARTED DURING THIS VISIT:  New Prescriptions   METOCLOPRAMIDE (REGLAN) 5 MG TABLET    Take 1 tablet (5 mg total) by mouth 3 (three) times  daily.     Note:  This document was prepared using Dragon voice recognition software and may include unintentional dictation errors.   Tommi Rumps, PA-C 02/01/16 1754    Emily Filbert, MD 02/02/16 [phone]

## 2016-02-01 NOTE — ED Triage Notes (Signed)
Patient presents to the ED with suprapubic pain, nausea, and vomiting x 2 days.  Patient reports vomiting x 3 today.  Patient is in no obvious distress at this time.  Patient reports coughing up yellowish/green phlegm.

## 2016-02-01 NOTE — Discharge Instructions (Signed)
Try to drink plenty of fluids and stay hydrated. Gatorade or juice may be easier on your stomach than water. You may take over the counter cough syrup as needed. You may take tylenol or ibuprofen as needed for fever and body aches.

## 2016-07-30 ENCOUNTER — Emergency Department
Admission: EM | Admit: 2016-07-30 | Discharge: 2016-07-30 | Disposition: A | Discharge status: Home / Self Care | Payer: Medicaid Other | Attending: Emergency Medicine | Admitting: Emergency Medicine

## 2016-07-30 DIAGNOSIS — F17210 Nicotine dependence, cigarettes, uncomplicated: Secondary | ICD-10-CM | POA: Diagnosis not present

## 2016-07-30 DIAGNOSIS — J45909 Unspecified asthma, uncomplicated: Secondary | ICD-10-CM | POA: Insufficient documentation

## 2016-07-30 DIAGNOSIS — R319 Hematuria, unspecified: Secondary | ICD-10-CM | POA: Diagnosis present

## 2016-07-30 DIAGNOSIS — N3091 Cystitis, unspecified with hematuria: Secondary | ICD-10-CM | POA: Diagnosis not present

## 2016-07-30 LAB — URINALYSIS, COMPLETE (UACMP) WITH MICROSCOPIC
BILIRUBIN URINE: NEGATIVE
GLUCOSE, UA: NEGATIVE mg/dL
Ketones, ur: NEGATIVE mg/dL
NITRITE: NEGATIVE
PH: 6 (ref 5.0–8.0)
Protein, ur: 100 mg/dL — AB
SPECIFIC GRAVITY, URINE: 1.01 (ref 1.005–1.030)

## 2016-07-30 LAB — POCT PREGNANCY, URINE: Preg Test, Ur: NEGATIVE

## 2016-07-30 MED ORDER — SULFAMETHOXAZOLE-TRIMETHOPRIM 800-160 MG PO TABS: 1.0000 | ORAL_TABLET | Freq: Two times a day (BID) | ORAL | 0 refills | Status: AC

## 2016-07-30 MED ORDER — IBUPROFEN 800 MG PO TABS: 800.0000 mg | ORAL_TABLET | Freq: Three times a day (TID) | ORAL | 0 refills | Status: DC | PRN

## 2016-07-30 MED ORDER — IBUPROFEN 600 MG PO TABS
600.0000 mg | ORAL_TABLET | Freq: Once | ORAL | Status: AC
  Administered 2016-07-30: 600 mg via ORAL
  Filled 2016-07-30: qty 1

## 2016-07-30 NOTE — Discharge Instructions (Signed)
Please return immediately if condition worsens. Please contact her primary physician or the physician you were given for referral. If you have any specialist physicians involved in her treatment and plan please also contact them. Thank you for using [facility]. Return emergency department especially for a high fever, persistent vomiting, flank pain, or any other new concerns  Please drink plenty of fluids and drink cranberry juice. He can also take over-the-counter Azo

## 2016-07-30 NOTE — ED Notes (Signed)
Pt resting in bed, resp even and unlabored, pt denies any needs 

## 2016-07-30 NOTE — ED Triage Notes (Signed)
Pt hx of recurrent UTI. Burning with urination, bright red blood with urination. Pt alert and oriented X4, active, cooperative, pt in NAD. RR even and unlabored, color WNL.

## 2016-07-30 NOTE — ED Notes (Addendum)
States she had a UTI last month and took abx, states Thursday she began to have symptoms again, they went away Friday, states this AM urinary symptoms returned, states burning and blood in her urine, states lower back and abd pain, awake and alert in no acute distress, pt resting in bed eating

## 2016-07-30 NOTE — ED Provider Notes (Signed)
Time Seen: Approximately 1548  I have reviewed the triage notes  Chief Complaint: Hematuria   History of Present Illness: Courtney Swanson is a 21 y.o. female who states that she had a recent urinary tract infection and took some antibiotics and is not aware of the name and her symptoms improved. She returns today with burning with urination and bright red blood in her urine. She states she has been recently sexually active and her discomfort starting last evening. A fever, abdominal pain, flank pain. She does have urinary urgency. She denies any nausea or vomiting.   Past Medical History:  Diagnosis Date  . Asthma     There are no active problems to display for this patient.   History reviewed. No pertinent surgical history.  History reviewed. No pertinent surgical history.  Current Outpatient Rx  . Order #: [id number] Class: Print  . Order #: [id number] Class: Print  . Order #: [id number] Class: Print  . Order #: [id number] Class: Print  . Order #: [id number] Class: Print  . Order #: [id number] Class: Print  . Order #: [id number] Class: Print  . Order #: [id number] Class: Print  . Order #: [id number] Class: Print  . Order #: [id number] Class: Print  . Order #: [id number] Class: Print    Allergies:  Amoxicillin and Tylenol [acetaminophen]  Family History: No family history on file.  Social History: Social History  Substance Use Topics  . Smoking status: Current Every Day Smoker    Types: Cigarettes  . Smokeless tobacco: Never Used  . Alcohol use No     Review of Systems:   10 point review of systems was performed and was otherwise negative:  Constitutional: No fever Eyes: No visual disturbances ENT: No sore throat, ear pain Cardiac: No chest pain Respiratory: No shortness of breath, wheezing, or stridor Abdomen: No abdominal pain, no vomiting, No diarrhea Endocrine: No weight loss, No night sweats Extremities: No peripheral edema, cyanosis Skin: No rashes, easy  bruising Neurologic: No focal weakness, trouble with speech or swollowing Urologic: Patient has burning with urination, hematuria and urinary frequency and urgency She denies any vaginal discharge or bleeding  Physical Exam:  ED Triage Vitals  Enc Vitals Group     BP 07/30/16 1347 138/80     Pulse Rate 07/30/16 1347 94     Resp 07/30/16 1347 18     Temp 07/30/16 1347 98.5 F (36.9 C)     Temp Source 07/30/16 1347 Oral     SpO2 07/30/16 1347 97 %     Weight 07/30/16 1348 164 lb (74.4 kg)     Height 07/30/16 1348 5\' 7"  (1.702 m)     Head Circumference --      Peak Flow --      Pain Score 07/30/16 1348 10     Pain Loc --      Pain Edu? --      Excl. in GC? --     General: Awake , Alert , and Oriented times 3; GCS 15 Head: Normal cephalic , atraumatic Eyes: Pupils equal , round, reactive to light Nose/Throat: No nasal drainage, patent upper airway without erythema or exudate.  Neck: Supple, Full range of motion, No anterior adenopathy or palpable thyroid masses Lungs: Clear to ascultation without wheezes , rhonchi, or rales Heart: Regular rate, regular rhythm without murmurs , gallops , or rubs Abdomen: Soft, non tender without rebound, guarding , or rigidity; bowel sounds positive and symmetric in all 4 quadrants. No organomegaly .  Extremities: 2 plus symmetric pulses. No edema, clubbing or cyanosis Neurologic: normal ambulation, Motor symmetric without deficits, sensory intact Skin: warm, dry, no rashes   Labs:   All laboratory work was reviewed including any pertinent negatives or positives listed below:  Labs Reviewed  URINALYSIS, COMPLETE (UACMP) WITH MICROSCOPIC - Abnormal; Notable for the following:       Result Value   Color, Urine YELLOW (*)    APPearance CLOUDY (*)    Hgb urine dipstick LARGE (*)    Protein, ur 100 (*)    Leukocytes, UA LARGE (*)    Bacteria, UA RARE (*)    Squamous Epithelial / LPF 0-5 (*)    All other components within normal limits   URINE CULTURE  POC URINE PREG, ED  POCT PREGNANCY, URINE  Urine culture is pending    ED Course: Patient's stay here was uneventful and the patient's otherwise hemodynamically stable and I felt did not have pyelonephritis at this time. Also felt was unlikely to be renal colic and presents with a history and laboratory work results consistent with hemorrhagic cystitis    Final Clinical Impression:   Final diagnoses:  Hemorrhagic cystitis     Plan:  Outpatient management " Discharge Medication List as of 07/30/2016  5:37 PM    START taking these medications   Details  sulfamethoxazole-trimethoprim (BACTRIM DS,SEPTRA DS) 800-160 MG tablet Take 1 tablet by mouth 2 (two) times daily., Starting Sat 07/30/2016, Until Sat 08/06/2016, Print      " Patient was advised to return immediately if condition worsens. Patient was advised to follow up with their primary care physician or other specialized physicians involved in their outpatient care. The patient and/or family member/power of attorney had laboratory results reviewed at the bedside. All questions and concerns were addressed and appropriate discharge instructions were distributed by the nursing staff.            Jennye Moccasin, MD 07/30/16 [phone]

## 2016-08-01 LAB — URINE CULTURE: CULTURE: NO GROWTH

## 2017-01-23 ENCOUNTER — Emergency Department
Admission: EM | Admit: 2017-01-23 | Discharge: 2017-01-23 | Disposition: A | Discharge status: Home / Self Care | Payer: Medicaid Other | Attending: Emergency Medicine | Admitting: Emergency Medicine

## 2017-01-23 ENCOUNTER — Encounter: Payer: Self-pay | Admitting: Emergency Medicine

## 2017-01-23 DIAGNOSIS — R070 Pain in throat: Secondary | ICD-10-CM | POA: Diagnosis not present

## 2017-01-23 DIAGNOSIS — F17210 Nicotine dependence, cigarettes, uncomplicated: Secondary | ICD-10-CM | POA: Diagnosis not present

## 2017-01-23 DIAGNOSIS — J45909 Unspecified asthma, uncomplicated: Secondary | ICD-10-CM | POA: Insufficient documentation

## 2017-01-23 DIAGNOSIS — R05 Cough: Secondary | ICD-10-CM | POA: Diagnosis present

## 2017-01-23 DIAGNOSIS — Z79899 Other long term (current) drug therapy: Secondary | ICD-10-CM | POA: Insufficient documentation

## 2017-01-23 DIAGNOSIS — J069 Acute upper respiratory infection, unspecified: Secondary | ICD-10-CM | POA: Diagnosis not present

## 2017-01-23 DIAGNOSIS — B9789 Other viral agents as the cause of diseases classified elsewhere: Secondary | ICD-10-CM

## 2017-01-23 MED ORDER — PROMETHAZINE-CODEINE 6.25-10 MG/5ML PO SYRP: 5.0000 mL | ORAL_SOLUTION | ORAL | 0 refills | Status: DC | PRN

## 2017-01-23 NOTE — Discharge Instructions (Signed)
Follow-up with her primary care provider for symptoms that are not improving over the next few days. Return to the emergency department for symptoms that change or worsen if you are unable schedule an appointment.

## 2017-01-23 NOTE — ED Triage Notes (Signed)
Cough and sorethroat since yesterday.   

## 2017-01-23 NOTE — ED Notes (Signed)
See triage note   States she developed some sinus congestion ,cough and scratchy throat about 2 days ago  States she feel like she is not able to get her breath  Low grade fever on arrival

## 2017-01-23 NOTE — ED Provider Notes (Signed)
[facility] Emergency Department Provider Note  ____________________________________________  Time seen: Approximately 6:09 PM  I have reviewed the triage vital signs and the nursing notes.   HISTORY  Chief Complaint Cough and Sore Throat   HPI Courtney Swanson is a 21 y.o. female who presents to the emergency department for evaluation of sinus congestion, cough, and sore throat 2 days ago.No relief with over-the-counter cold and sinus medication. She has also used her albuterol inhaler without any relief. She denies nausea, vomiting, diarrhea, or fever.   Past Medical History:  Diagnosis Date  . Asthma     There are no active problems to display for this patient.   History reviewed. No pertinent surgical history.  Prior to Admission medications   Medication Sig Start Date End Date Taking? Authorizing Provider  clonazePAM (KLONOPIN) 0.5 MG tablet Take 0.5 mg by mouth 2 (two) times daily.   Yes [provider]  albuterol (PROVENTIL HFA;VENTOLIN HFA) 108 (90 Base) MCG/ACT inhaler Inhale 2 puffs into the lungs every 6 (six) hours as needed for wheezing or shortness of breath. 09/08/15   Beers, Charmayne Sheerharles M, PA-C  chlorpheniramine (CHLOR-TRIMETON) 4 MG tablet Take 1 tablet (4 mg total) by mouth 2 (two) times daily as needed for allergies or rhinitis. 09/08/15   Beers, Charmayne Sheerharles M, PA-C  fluticasone (FLONASE) 50 MCG/ACT nasal spray Place 1 spray into both nostrils daily. 09/08/15 09/07/16  Beers, Charmayne Sheerharles M, PA-C  ibuprofen (ADVIL,MOTRIN) 800 MG tablet Take 1 tablet (800 mg total) by mouth every 8 (eight) hours as needed. 07/30/16   Jennye MoccasinQuigley, Brian S, MD  lidocaine (XYLOCAINE) 2 % solution Use as directed 5 mLs in the mouth or throat every 6 (six) hours as needed for mouth pain. 5 mL (felt DM for swish and swallow. 10/31/15   Joni ReiningSmith, Ronald K, PA-C  metoCLOPramide (REGLAN) 5 MG tablet Take 1 tablet (5 mg total) by mouth 3 (three) times daily. 02/01/16 01/31/17   Tommi RumpsSummers, Rhonda L, PA-C  promethazine-codeine (PHENERGAN WITH CODEINE) 6.25-10 MG/5ML syrup Take 5 mLs by mouth every 4 (four) hours as needed for cough. 01/23/17   Deira Shimer, Rulon Eisenmengerari B, FNP    Allergies Amoxicillin and Tylenol [acetaminophen]  No family history on file.  Social History Social History  Substance Use Topics  . Smoking status: Current Every Day Smoker    Packs/day: 0.50    Types: Cigarettes  . Smokeless tobacco: Never Used  . Alcohol use No    Review of Systems Constitutional: Negative for fever/chills ENT: Positive for sore throat. Cardiovascular: Denies chest pain. Respiratory: Negative for shortness of breath. Positive for cough. Gastrointestinal: Negative for nausea,  no vomiting.  Negative for diarrhea.  Musculoskeletal: Negative for body aches Skin: Negative for rash. Neurological: Positive for headaches ____________________________________________   PHYSICAL EXAM:  VITAL SIGNS: ED Triage Vitals  Enc Vitals Group     BP 01/23/17 1655 117/66     Pulse Rate 01/23/17 1655 90     Resp 01/23/17 1655 18     Temp 01/23/17 1655 99 F (37.2 C)     Temp Source 01/23/17 1655 Oral     SpO2 01/23/17 1655 96 %     Weight 01/23/17 1656 164 lb (74.4 kg)     Height 01/23/17 1656 5\' 7"  (1.702 m)     Head Circumference --      Peak Flow --      Pain Score 01/23/17 1655 10     Pain Loc --  Pain Edu? --      Excl. in GC? --     Constitutional: Alert and oriented. Acutely ill appearing and in no acute distress. Eyes: Conjunctivae are normal. EOMI. Ears: Bilateral TM clear without erythema Nose: Pansinus congestion noted; clear rhinnorhea. Mouth/Throat: Mucous membranes are moist.  Oropharynx mildly erythematous. Tonsils 1+ without exudate. Neck: No stridor.  Lymphatic: No cervical lymphadenopathy. Cardiovascular: Normal rate, regular rhythm. Good peripheral circulation. Respiratory: Normal respiratory effort.  No retractions. Breath sounds clear to  auscultation throughout. Gastrointestinal: Soft and nontender.  Musculoskeletal: FROM x 4 extremities.  Neurologic:  Normal speech and language.  Skin:  Skin is warm, dry and intact. No rash noted. Psychiatric: Mood and affect are normal. Speech and behavior are normal.  ____________________________________________   LABS (all labs ordered are listed, but only abnormal results are displayed)  Labs Reviewed - No data to display ____________________________________________  EKG  Not indicated ____________________________________________  RADIOLOGY  Not indicated ____________________________________________   PROCEDURES  Procedure(s) performed: None  Critical Care performed: No ____________________________________________   INITIAL IMPRESSION / ASSESSMENT AND PLAN / ED COURSE  21 year old female presenting to the emergency department for treatment and evaluation of symptoms and exam most consistent with viral upper respiratory infection. She was given a prescription for promethazine with codeine cough syrup. She was advised to take Tylenol or ibuprofen for pain or fever. She was advised to follow-up with the primary care provider for choice for symptoms that are not improving over the next several days. She was advised to return to the emergency department for symptoms that change or worsen if she is unable schedule an appointment.  Pertinent labs & imaging results that were available during my care of the patient were reviewed by me and considered in my medical decision making (see chart for details).  Discharge Medication List as of 01/23/2017  6:26 PM    START taking these medications   Details  promethazine-codeine (PHENERGAN WITH CODEINE) 6.25-10 MG/5ML syrup Take 5 mLs by mouth every 4 (four) hours as needed for cough., Starting Mon 01/23/2017, Print        If controlled substance prescribed during this visit, 12 month history viewed on the [place] prior to issuing an  initial prescription for Schedule II or III opiod. ____________________________________________   FINAL CLINICAL IMPRESSION(S) / ED DIAGNOSES  Final diagnoses:  Viral URI with cough    Note:  This document was prepared using Dragon voice recognition software and may include unintentional dictation errors.     Chinita Pester, FNP 01/23/17 2046    Minna Antis, MD 01/23/17 2329

## 2017-04-23 ENCOUNTER — Emergency Department: Payer: Medicaid Other

## 2017-04-23 ENCOUNTER — Emergency Department
Admission: EM | Admit: 2017-04-23 | Discharge: 2017-04-23 | Disposition: A | Discharge status: Home / Self Care | Payer: Medicaid Other | Attending: Emergency Medicine | Admitting: Emergency Medicine

## 2017-04-23 DIAGNOSIS — B9789 Other viral agents as the cause of diseases classified elsewhere: Secondary | ICD-10-CM | POA: Diagnosis not present

## 2017-04-23 DIAGNOSIS — F17210 Nicotine dependence, cigarettes, uncomplicated: Secondary | ICD-10-CM | POA: Insufficient documentation

## 2017-04-23 DIAGNOSIS — J069 Acute upper respiratory infection, unspecified: Secondary | ICD-10-CM | POA: Insufficient documentation

## 2017-04-23 DIAGNOSIS — Z79899 Other long term (current) drug therapy: Secondary | ICD-10-CM | POA: Diagnosis not present

## 2017-04-23 DIAGNOSIS — R05 Cough: Secondary | ICD-10-CM | POA: Diagnosis present

## 2017-04-23 DIAGNOSIS — J45909 Unspecified asthma, uncomplicated: Secondary | ICD-10-CM | POA: Insufficient documentation

## 2017-04-23 LAB — URINALYSIS, COMPLETE (UACMP) WITH MICROSCOPIC
BILIRUBIN URINE: NEGATIVE
Glucose, UA: NEGATIVE mg/dL
Ketones, ur: NEGATIVE mg/dL
NITRITE: NEGATIVE
PH: 6 (ref 5.0–8.0)
Protein, ur: 30 mg/dL — AB
SPECIFIC GRAVITY, URINE: 1.019 (ref 1.005–1.030)

## 2017-04-23 LAB — PREGNANCY, URINE: Preg Test, Ur: NEGATIVE

## 2017-04-23 MED ORDER — CIPROFLOXACIN HCL 500 MG PO TABS: 500.0000 mg | ORAL_TABLET | Freq: Two times a day (BID) | ORAL | 0 refills | Status: AC

## 2017-04-23 MED ORDER — PHENAZOPYRIDINE HCL 200 MG PO TABS
ORAL_TABLET | ORAL | Status: AC
  Filled 2017-04-23: qty 1

## 2017-04-23 MED ORDER — CIPROFLOXACIN HCL 500 MG PO TABS
500.0000 mg | ORAL_TABLET | Freq: Once | ORAL | Status: AC
  Administered 2017-04-23: 500 mg via ORAL
  Filled 2017-04-23: qty 1

## 2017-04-23 MED ORDER — GUAIFENESIN-CODEINE 100-10 MG/5ML PO SYRP: 5.0000 mL | ORAL_SOLUTION | Freq: Three times a day (TID) | ORAL | 0 refills | Status: AC | PRN

## 2017-04-23 MED ORDER — PHENAZOPYRIDINE HCL 100 MG PO TABS
100.0000 mg | ORAL_TABLET | Freq: Once | ORAL | Status: AC
  Administered 2017-04-23: 100 mg via ORAL
  Filled 2017-04-23: qty 1

## 2017-04-23 MED ORDER — BENZONATATE 100 MG PO CAPS: 100.0000 mg | ORAL_CAPSULE | Freq: Three times a day (TID) | ORAL | 0 refills | Status: DC | PRN

## 2017-04-23 MED ORDER — PHENAZOPYRIDINE HCL 100 MG PO TABS: 100.0000 mg | ORAL_TABLET | Freq: Three times a day (TID) | ORAL | 0 refills | Status: AC | PRN

## 2017-04-23 NOTE — ED Triage Notes (Signed)
Pt presents with congestion, cough, productive cough, and sore throat x2 days. Also reports UTI symptoms per pt report.

## 2017-04-23 NOTE — ED Provider Notes (Signed)
[facility] Emergency Department Provider Note  ____________________________________________  Time seen: Approximately 5:59 PM  I have reviewed the triage vital signs and the nursing notes.   HISTORY  Chief Complaint Cough and Sore Throat    HPI Courtney Swanson is a 21 y.o. female that presents to the emergency department for evaluation of nasal congestion, productive cough, sore throat for 2 days and dysuria and frequency of urination for 1 day.  Patient is coughing up yellow sputum.  He son has similar URI symptoms.  Patient states that she has pain in her bladder.  Patient states that she gets frequent UTIs and this feels the same.  Last UTI, she had frank blood in her urine.  She has not noticed any blood this time.  She has never had a kidney stone.  She had chills yesterday.  She has a NuvaRing and has a menstrual cycle every 3  months.  She smokes half a pack of cigarettes per day and started using a vapor yesterday. She is in the room eating chicken wings. She denies vomiting, back pain, flank pain.  Past Medical History:  Diagnosis Date  . Asthma     There are no active problems to display for this patient.   History reviewed. No pertinent surgical history.  Prior to Admission medications   Medication Sig Start Date End Date Taking? Authorizing Provider  albuterol (PROVENTIL HFA;VENTOLIN HFA) 108 (90 Base) MCG/ACT inhaler Inhale 2 puffs into the lungs every 6 (six) hours as needed for wheezing or shortness of breath. 09/08/15   Beers, Charmayne Sheerharles M, PA-C  benzonatate (TESSALON PERLES) 100 MG capsule Take 1 capsule (100 mg total) by mouth 3 (three) times daily as needed for cough. 04/23/17 04/23/18  Enid DerryWagner, Shaheem Pichon, PA-C  chlorpheniramine (CHLOR-TRIMETON) 4 MG tablet Take 1 tablet (4 mg total) by mouth 2 (two) times daily as needed for allergies or rhinitis. 09/08/15   Beers, Charmayne Sheerharles M, PA-C  ciprofloxacin (CIPRO) 500 MG tablet Take 1 tablet (500 mg total) by  mouth 2 (two) times daily for 7 days. 04/23/17 04/30/17  Enid DerryWagner, Shatina Streets, PA-C  clonazePAM (KLONOPIN) 0.5 MG tablet Take 0.5 mg by mouth 2 (two) times daily.    [provider]  fluticasone (FLONASE) 50 MCG/ACT nasal spray Place 1 spray into both nostrils daily. 09/08/15 09/07/16  Beers, Charmayne Sheerharles M, PA-C  guaiFENesin-codeine (ROBITUSSIN AC) 100-10 MG/5ML syrup Take 5 mLs by mouth 3 (three) times daily as needed for up to 2 days for cough. 04/23/17 04/25/17  Enid DerryWagner, Koby Pickup, PA-C  ibuprofen (ADVIL,MOTRIN) 800 MG tablet Take 1 tablet (800 mg total) by mouth every 8 (eight) hours as needed. 07/30/16   Jennye MoccasinQuigley, Brian S, MD  lidocaine (XYLOCAINE) 2 % solution Use as directed 5 mLs in the mouth or throat every 6 (six) hours as needed for mouth pain. 5 mL (felt DM for swish and swallow. 10/31/15   Joni ReiningSmith, Ronald K, PA-C  metoCLOPramide (REGLAN) 5 MG tablet Take 1 tablet (5 mg total) by mouth 3 (three) times daily. 02/01/16 01/31/17  Tommi RumpsSummers, Rhonda L, PA-C  phenazopyridine (PYRIDIUM) 100 MG tablet Take 1 tablet (100 mg total) by mouth 3 (three) times daily as needed for up to 2 days for pain. 04/23/17 04/25/17  Enid DerryWagner, Adalena Abdulla, PA-C  promethazine-codeine (PHENERGAN WITH CODEINE) 6.25-10 MG/5ML syrup Take 5 mLs by mouth every 4 (four) hours as needed for cough. 01/23/17   Triplett, Rulon Eisenmengerari B, FNP    Allergies Amoxicillin and Tylenol [acetaminophen]  History reviewed. No  pertinent family history.  Social History Social History   Tobacco Use  . Smoking status: Current Every Day Smoker    Packs/day: 0.50    Types: Cigarettes  . Smokeless tobacco: Never Used  Substance Use Topics  . Alcohol use: No  . Drug use: Not on file     Review of Systems  Constitutional: No fever/chills Eyes: No visual changes. No discharge. ENT: Positive for congestion and rhinorrhea. Cardiovascular: No chest pain. Respiratory: Positive for cough. No SOB. Gastrointestinal: No nausea, no vomiting.  No diarrhea.  No  constipation. Musculoskeletal: Negative for musculoskeletal pain. Skin: Negative for rash, abrasions, lacerations, ecchymosis. Neurological: Negative for headaches.   ____________________________________________   PHYSICAL EXAM:  VITAL SIGNS: ED Triage Vitals  Enc Vitals Group     BP 04/23/17 1636 116/79     Pulse Rate 04/23/17 1636 87     Resp 04/23/17 1636 14     Temp 04/23/17 1636 98.1 F (36.7 C)     Temp Source 04/23/17 1636 Oral     SpO2 04/23/17 1636 96 %     Weight 04/23/17 1637 170 lb (77.1 kg)     Height 04/23/17 1637 5\' 7"  (1.702 m)     Head Circumference --      Peak Flow --      Pain Score 04/23/17 1636 10     Pain Loc --      Pain Edu? --      Excl. in GC? --      Constitutional: Alert and oriented. Well appearing and in no acute distress. Eyes: Conjunctivae are normal. PERRL. EOMI. No discharge. Head: Atraumatic. ENT: No frontal and maxillary sinus tenderness.      Ears: Tympanic membranes pearly gray with good landmarks. No discharge.      Nose: Mild congestion/rhinnorhea.      Mouth/Throat: Mucous membranes are moist. Oropharynx non-erythematous. Tonsils not enlarged. No exudates. Uvula midline. Neck: No stridor.   Hematological/Lymphatic/Immunilogical: No cervical lymphadenopathy. Cardiovascular: Normal rate, regular rhythm.  Good peripheral circulation. Respiratory: Normal respiratory effort without tachypnea or retractions. Lungs CTAB. Good air entry to the bases with no decreased or absent breath sounds. Gastrointestinal: Bowel sounds 4 quadrants. Soft and nontender to palpation. No guarding or rigidity. No palpable masses. No distention. Musculoskeletal: Full range of motion to all extremities. No gross deformities appreciated. Neurologic:  Normal speech and language. No gross focal neurologic deficits are appreciated.  Skin:  Skin is warm, dry and intact. No rash noted.   ____________________________________________   LABS (all labs ordered  are listed, but only abnormal results are displayed)  Labs Reviewed  URINALYSIS, COMPLETE (UACMP) WITH MICROSCOPIC - Abnormal; Notable for the following components:      Result Value   Color, Urine YELLOW (*)    APPearance CLOUDY (*)    Hgb urine dipstick MODERATE (*)    Protein, ur 30 (*)    Leukocytes, UA LARGE (*)    Bacteria, UA RARE (*)    Squamous Epithelial / LPF 6-30 (*)    All other components within normal limits  PREGNANCY, URINE   ____________________________________________  EKG   ____________________________________________  RADIOLOGY  Dg Chest 2 View  Result Date: 04/23/2017 CLINICAL DATA:  Congestion.  Productive cough. EXAM: CHEST  2 VIEW COMPARISON:  November 14, 2015 FINDINGS: The heart size and mediastinal contours are within normal limits. Both lungs are clear. The visualized skeletal structures are unremarkable. IMPRESSION: No active cardiopulmonary disease. Electronically Signed   By: Gerome Samavid  Williams  III M.D   On: 04/23/2017 18:32    ____________________________________________    PROCEDURES  Procedure(s) performed:    Procedures    Medications  phenazopyridine (PYRIDIUM) 200 MG tablet (not administered)  phenazopyridine (PYRIDIUM) tablet 100 mg (100 mg Oral Given 04/23/17 1754)  ciprofloxacin (CIPRO) tablet 500 mg (500 mg Oral Given 04/23/17 1754)     ____________________________________________   INITIAL IMPRESSION / ASSESSMENT AND PLAN / ED COURSE  Pertinent labs & imaging results that were available during my care of the patient were reviewed by me and considered in my medical decision making (see chart for details).  Review of the [place] CSRS was performed in accordance of the [facility] prior to dispensing any controlled drugs.   Patient's diagnosis is consistent with viral URI and urinary tract infection. Vital signs and exam are reassuring.  Chest x-ray negative for acute processes.  Urinalysis consistent with infection.  Patient states that  this feels the same as her last UTI. Patient appears well and is staying well hydrated. Patient should alternate tylenol and ibuprofen for fever. Patient feels comfortable going home. Patient will be discharged home with prescriptions for ciprofloxacin, Pyridium, guaifenesin. Patient is to follow up with PCP and urology as needed or otherwise directed. Patient is given ED precautions to return to the ED for any worsening or new symptoms.     ____________________________________________  FINAL CLINICAL IMPRESSION(S) / ED DIAGNOSES  Final diagnoses:  Upper respiratory tract infection, unspecified type  Viral upper respiratory tract infection      NEW MEDICATIONS STARTED DURING THIS VISIT:  ED Discharge Orders        Ordered    ciprofloxacin (CIPRO) 500 MG tablet  2 times daily     04/23/17 1838    phenazopyridine (PYRIDIUM) 100 MG tablet  3 times daily PRN     04/23/17 1838    guaiFENesin-codeine (ROBITUSSIN AC) 100-10 MG/5ML syrup  3 times daily PRN     04/23/17 1838    benzonatate (TESSALON PERLES) 100 MG capsule  3 times daily PRN     04/23/17 1838          This chart was dictated using voice recognition software/Dragon. Despite best efforts to proofread, errors can occur which can change the meaning. Any change was purely unintentional.    Enid Derry, PA-C 04/23/17 1907    Jeanmarie Plant, MD 04/27/17 1539

## 2017-04-27 LAB — POCT RAPID STREP A: STREPTOCOCCUS, GROUP A SCREEN (DIRECT): NEGATIVE

## 2017-06-29 ENCOUNTER — Emergency Department
Admission: EM | Admit: 2017-06-29 | Discharge: 2017-06-29 | Disposition: A | Discharge status: Home / Self Care | Payer: Medicaid Other | Attending: Emergency Medicine | Admitting: Emergency Medicine

## 2017-06-29 ENCOUNTER — Encounter: Payer: Self-pay | Admitting: Emergency Medicine

## 2017-06-29 DIAGNOSIS — Z79899 Other long term (current) drug therapy: Secondary | ICD-10-CM | POA: Insufficient documentation

## 2017-06-29 DIAGNOSIS — J101 Influenza due to other identified influenza virus with other respiratory manifestations: Secondary | ICD-10-CM | POA: Insufficient documentation

## 2017-06-29 DIAGNOSIS — J029 Acute pharyngitis, unspecified: Secondary | ICD-10-CM | POA: Diagnosis present

## 2017-06-29 DIAGNOSIS — J111 Influenza due to unidentified influenza virus with other respiratory manifestations: Secondary | ICD-10-CM

## 2017-06-29 DIAGNOSIS — F17210 Nicotine dependence, cigarettes, uncomplicated: Secondary | ICD-10-CM | POA: Diagnosis not present

## 2017-06-29 DIAGNOSIS — J45909 Unspecified asthma, uncomplicated: Secondary | ICD-10-CM | POA: Diagnosis not present

## 2017-06-29 MED ORDER — DEXAMETHASONE SODIUM PHOSPHATE 10 MG/ML IJ SOLN
10.0000 mg | Freq: Once | INTRAMUSCULAR | Status: AC
  Administered 2017-06-29: 10 mg via INTRAMUSCULAR
  Filled 2017-06-29: qty 1

## 2017-06-29 MED ORDER — LIDOCAINE VISCOUS 2 % MT SOLN
15.0000 mL | Freq: Once | OROMUCOSAL | Status: AC
  Administered 2017-06-29: 15 mL via OROMUCOSAL
  Filled 2017-06-29: qty 15

## 2017-06-29 MED ORDER — BENZONATATE 100 MG PO CAPS: ORAL_CAPSULE | ORAL | 0 refills | Status: DC

## 2017-06-29 MED ORDER — ONDANSETRON 4 MG PO TBDP: 4.0000 mg | ORAL_TABLET | Freq: Three times a day (TID) | ORAL | 0 refills | Status: DC | PRN

## 2017-06-29 MED ORDER — ONDANSETRON 4 MG PO TBDP
4.0000 mg | ORAL_TABLET | Freq: Once | ORAL | Status: AC
  Administered 2017-06-29: 4 mg via ORAL
  Filled 2017-06-29: qty 1

## 2017-06-29 MED ORDER — OSELTAMIVIR PHOSPHATE 75 MG PO CAPS: 75.0000 mg | ORAL_CAPSULE | Freq: Two times a day (BID) | ORAL | 0 refills | Status: AC

## 2017-06-29 MED ORDER — IBUPROFEN 600 MG PO TABS
600.0000 mg | ORAL_TABLET | Freq: Once | ORAL | Status: AC
  Administered 2017-06-29: 600 mg via ORAL
  Filled 2017-06-29: qty 1

## 2017-06-29 NOTE — ED Triage Notes (Signed)
Pt in via POV with complaints of sore throat, congestion, body aches x two days.  NAD noted at this time.

## 2017-06-29 NOTE — Discharge Instructions (Signed)
Your symptoms are consistent with influenza (flu). Take the prescription meds as directed. Drink Powerade & Gatorade to replenish electrolytes. Take OTC ibuprofen for fevers and bodyaches. Take an OTC cough medicine (Delsym or Robitussin) as directed. Follow-up with your provider as needed.

## 2017-06-29 NOTE — ED Notes (Addendum)
Pt c/o sore throat, congestion, cough, and body aches. A&O x4.

## 2017-06-29 NOTE — ED Provider Notes (Signed)
[name] ____________________________________________  Time seen: 1227  I have reviewed the triage vital signs and the nursing notes.  HISTORY  Chief Complaint  Nasal Congestion and Cough  HPI Courtney Swanson is a 22 y.o. female to the ED from home with complaints of sudden onset of sore throat, congestion, and body aches.  She describes symptoms began last night.  She denies any vomiting but notes nausea and abdominal pain.  She denies any chest pain or shortness of breath, but does note intermittent cough.  She did not receive the seasonal flu vaccine.  She reports generalized body aches, malaise, and fatigue.  She reports that her son had high fevers and similar symptoms earlier in the week.  Past Medical History:  Diagnosis Date  . Asthma     There are no active problems to display for this patient.   History reviewed. No pertinent surgical history.  Prior to Admission medications   Medication Sig Start Date End Date Taking? Authorizing Provider  albuterol (PROVENTIL HFA;VENTOLIN HFA) 108 (90 Base) MCG/ACT inhaler Inhale 2 puffs into the lungs every 6 (six) hours as needed for wheezing or shortness of breath. 09/08/15   Beers, Charmayne Sheer, PA-C  benzonatate (TESSALON PERLES) 100 MG capsule Take 1-2 tabs TID prn cough 06/29/17   Steaven Wholey, Charlesetta Ivory, PA-C  chlorpheniramine (CHLOR-TRIMETON) 4 MG tablet Take 1 tablet (4 mg total) by mouth 2 (two) times daily as needed for allergies or rhinitis. 09/08/15   Beers, Charmayne Sheer, PA-C  clonazePAM (KLONOPIN) 0.5 MG tablet Take 0.5 mg by mouth 2 (two) times daily.    [provider]  fluticasone (FLONASE) 50 MCG/ACT nasal spray Place 1 spray into both nostrils daily. 09/08/15 09/07/16  Beers, Charmayne Sheer, PA-C  ibuprofen (ADVIL,MOTRIN) 800 MG tablet Take 1 tablet (800 mg total) by mouth every 8 (eight) hours as needed. 07/30/16   Jennye Moccasin, MD  lidocaine (XYLOCAINE) 2 %  solution Use as directed 5 mLs in the mouth or throat every 6 (six) hours as needed for mouth pain. 5 mL (felt DM for swish and swallow. 10/31/15   Joni Reining, PA-C  metoCLOPramide (REGLAN) 5 MG tablet Take 1 tablet (5 mg total) by mouth 3 (three) times daily. 02/01/16 01/31/17  Tommi Rumps, PA-C  ondansetron (ZOFRAN ODT) 4 MG disintegrating tablet Take 1 tablet (4 mg total) by mouth every 8 (eight) hours as needed. 06/29/17   Frankey Botting, Charlesetta Ivory, PA-C  oseltamivir (TAMIFLU) 75 MG capsule Take 1 capsule (75 mg total) by mouth 2 (two) times daily for 5 days. 06/29/17 07/04/17  Latesia Norrington, Charlesetta Ivory, PA-C  promethazine-codeine (PHENERGAN WITH CODEINE) 6.25-10 MG/5ML syrup Take 5 mLs by mouth every 4 (four) hours as needed for cough. 01/23/17   Triplett, Rulon Eisenmenger B, FNP    Allergies Amoxicillin and Tylenol [acetaminophen]  No family history on file.  Social History Social History   Tobacco Use  . Smoking status: Current Every Day Smoker    Packs/day: 0.50    Types: Cigarettes  . Smokeless tobacco: Never Used  Substance Use Topics  . Alcohol use: No  . Drug use: Not on file    Review of Systems  Constitutional: Positive for fever & chills. Eyes: Negative for visual changes. ENT: Positive for sore throat. Cardiovascular: Negative for chest pain. Respiratory: Negative for shortness of breath.  Reports intermittent cough. Gastrointestinal: Negative for abdominal pain, vomiting and diarrhea. Genitourinary: Negative for dysuria. Musculoskeletal:  Negative for back pain. Reports generalized body aches. Skin: Negative for rash. Neurological: Negative for headaches, focal weakness or numbness. ____________________________________________  PHYSICAL EXAM:  VITAL SIGNS: ED Triage Vitals  Enc Vitals Group     BP 06/29/17 1141 113/64     Pulse Rate 06/29/17 1141 86     Resp 06/29/17 1141 16     Temp 06/29/17 1141 98.6 F (37 C)     Temp Source 06/29/17 1141 Oral     SpO2 06/29/17  1141 97 %     Weight 06/29/17 1142 164 lb (74.4 kg)     Height 06/29/17 1142 5\' 7"  (1.702 m)     Head Circumference --      Peak Flow --      Pain Score --      Pain Loc --      Pain Edu? --      Excl. in GC? --     Constitutional: Alert and oriented. Well appearing and in no distress. Head: Normocephalic and atraumatic. Eyes: Conjunctivae are normal. PERRL. Normal extraocular movements Ears: Canals clear. TMs intact bilaterally.  No serous or purulent effusion is noted. Nose: No congestion/rhinorrhea/epistaxis.   Mouth/Throat: Mucous membranes are moist.  Uvula is midline and tonsils are flat. Neck: Supple. No thyromegaly. Hematological/Lymphatic/Immunological: No cervical lymphadenopathy. Cardiovascular: Normal rate, regular rhythm. Normal distal pulses. Respiratory: Normal respiratory effort. No wheezes/rales/rhonchi. Gastrointestinal: Soft and nontender. No distention. ____________________________________________  PROCEDURES  Procedures Decadron 10 mg IM  Zofran 4 mg ODT Viscous lidocaine 2% PO ____________________________________________  INITIAL IMPRESSION / ASSESSMENT AND PLAN / ED COURSE  Patient presents for ED evaluation of sudden onset of cough, congestion, sore throat, and body aches.  Patient experienced fevers and chills in the last 24-48 hours.  She did not receive the seasonal flu vaccine, etc. symptoms likely represent a viral etiology caused by influenza.  Patient has declined flu testing at this time and will be treated empirically for flu.  He is discharged with prescriptions for Tamiflu, Zofran, and Tessalon Perles.  Her vital signs remained stable in the ED and she is discharged without incident.  She will does over-the-counter cough medicines for additional symptom relief, and is encouraged to drink sports drinks to prevent dehydration.  She is advised further, to consider the flu vaccine when she is beyond his current infection.  Work note is provided for 2  days as requested. ____________________________________________  FINAL CLINICAL IMPRESSION(S) / ED DIAGNOSES  Final diagnoses:  Influenza      Courtney Swanson, Courtney Fryer V Bacon, PA-C 06/29/17 1829    Minna AntisPaduchowski, Kevin, MD 06/30/17 2204

## 2017-06-29 NOTE — ED Notes (Signed)
  FIRST NURSE NOTE: Pt to ER via POV ambulatory. C/o cough and congestion. Pt alert and oriented X4, active, cooperative, pt in NAD. RR even and unlabored, color WNL.  With son seeking tx for same sx.

## 2018-04-16 ENCOUNTER — Emergency Department: Payer: Self-pay

## 2018-04-16 ENCOUNTER — Emergency Department
Admission: EM | Admit: 2018-04-16 | Discharge: 2018-04-16 | Disposition: A | Discharge status: Home / Self Care | Payer: Self-pay | Attending: Emergency Medicine | Admitting: Emergency Medicine

## 2018-04-16 ENCOUNTER — Other Ambulatory Visit: Payer: Self-pay

## 2018-04-16 ENCOUNTER — Encounter: Payer: Self-pay | Admitting: Emergency Medicine

## 2018-04-16 DIAGNOSIS — R112 Nausea with vomiting, unspecified: Secondary | ICD-10-CM | POA: Insufficient documentation

## 2018-04-16 DIAGNOSIS — R1084 Generalized abdominal pain: Secondary | ICD-10-CM | POA: Insufficient documentation

## 2018-04-16 DIAGNOSIS — R109 Unspecified abdominal pain: Secondary | ICD-10-CM

## 2018-04-16 DIAGNOSIS — R197 Diarrhea, unspecified: Secondary | ICD-10-CM | POA: Insufficient documentation

## 2018-04-16 DIAGNOSIS — J45909 Unspecified asthma, uncomplicated: Secondary | ICD-10-CM | POA: Insufficient documentation

## 2018-04-16 DIAGNOSIS — F17210 Nicotine dependence, cigarettes, uncomplicated: Secondary | ICD-10-CM | POA: Insufficient documentation

## 2018-04-16 DIAGNOSIS — Z79899 Other long term (current) drug therapy: Secondary | ICD-10-CM | POA: Insufficient documentation

## 2018-04-16 LAB — COMPREHENSIVE METABOLIC PANEL
ALBUMIN: 3.8 g/dL (ref 3.5–5.0)
ALT: 11 U/L (ref 0–44)
AST: 19 U/L (ref 15–41)
Alkaline Phosphatase: 36 U/L — ABNORMAL LOW (ref 38–126)
Anion gap: 9 (ref 5–15)
BUN: 6 mg/dL (ref 6–20)
CHLORIDE: 109 mmol/L (ref 98–111)
CO2: 22 mmol/L (ref 22–32)
CREATININE: 0.58 mg/dL (ref 0.44–1.00)
Calcium: 8.8 mg/dL — ABNORMAL LOW (ref 8.9–10.3)
GFR calc Af Amer: >60 mL/min (ref >60)
GFR calc non Af Amer: >60 mL/min (ref >60)
Glucose, Bld: 106 mg/dL — ABNORMAL HIGH (ref 70–99)
Potassium: 3.6 mmol/L (ref 3.5–5.1)
SODIUM: 140 mmol/L (ref 135–145)
Total Bilirubin: 0.1 mg/dL — ABNORMAL LOW (ref 0.3–1.2)
Total Protein: 7.6 g/dL (ref 6.5–8.1)

## 2018-04-16 LAB — CBC WITH DIFFERENTIAL/PLATELET
ABS IMMATURE GRANULOCYTES: 0.18 10*3/uL — AB (ref 0.00–0.07)
BASOS ABS: 0.1 10*3/uL (ref 0.0–0.1)
BASOS PCT: 0 %
Eosinophils Absolute: 0.1 10*3/uL (ref 0.0–0.5)
Eosinophils Relative: 0 %
HCT: 44.7 % (ref 36.0–46.0)
Hemoglobin: 14.6 g/dL (ref 12.0–15.0)
IMMATURE GRANULOCYTES: 1 %
Lymphocytes Relative: 16 %
Lymphs Abs: 3.6 10*3/uL (ref 0.7–4.0)
MCH: 29.6 pg (ref 26.0–34.0)
MCHC: 32.7 g/dL (ref 30.0–36.0)
MCV: 90.7 fL (ref 80.0–100.0)
MONOS PCT: 7 %
Monocytes Absolute: 1.5 10*3/uL — ABNORMAL HIGH (ref 0.1–1.0)
NEUTROS ABS: 17.8 10*3/uL — AB (ref 1.7–7.7)
NEUTROS PCT: 76 %
NRBC: 0 % (ref 0.0–0.2)
PLATELETS: 261 10*3/uL (ref 150–400)
RBC: 4.93 MIL/uL (ref 3.87–5.11)
RDW: 12.3 % (ref 11.5–15.5)
WBC: 23.3 10*3/uL — ABNORMAL HIGH (ref 4.0–10.5)

## 2018-04-16 LAB — URINALYSIS, ROUTINE W REFLEX MICROSCOPIC
Bilirubin Urine: NEGATIVE
GLUCOSE, UA: NEGATIVE mg/dL
HGB URINE DIPSTICK: NEGATIVE
Ketones, ur: 5 mg/dL — AB
Leukocytes, UA: NEGATIVE
Nitrite: NEGATIVE
PROTEIN: NEGATIVE mg/dL
Specific Gravity, Urine: 1.017 (ref 1.005–1.030)
pH: 5 (ref 5.0–8.0)

## 2018-04-16 LAB — LIPASE, BLOOD: Lipase: 25 U/L (ref 11–51)

## 2018-04-16 LAB — MAGNESIUM: MAGNESIUM: 1.8 mg/dL (ref 1.7–2.4)

## 2018-04-16 LAB — PREGNANCY, URINE: PREG TEST UR: NEGATIVE

## 2018-04-16 MED ORDER — MORPHINE SULFATE (PF) 4 MG/ML IV SOLN
4.0000 mg | Freq: Once | INTRAVENOUS | Status: AC
  Administered 2018-04-16: 4 mg via INTRAVENOUS
  Filled 2018-04-16: qty 1

## 2018-04-16 MED ORDER — IOPAMIDOL (ISOVUE-300) INJECTION 61%
100.0000 mL | Freq: Once | INTRAVENOUS | Status: AC | PRN
  Administered 2018-04-16: 100 mL via INTRAVENOUS

## 2018-04-16 MED ORDER — ONDANSETRON HCL 4 MG/2ML IJ SOLN
4.0000 mg | INTRAMUSCULAR | Status: AC
  Administered 2018-04-16: 4 mg via INTRAVENOUS
  Filled 2018-04-16: qty 2

## 2018-04-16 MED ORDER — ONDANSETRON 4 MG PO TBDP: ORAL_TABLET | ORAL | 0 refills | Status: DC

## 2018-04-16 MED ORDER — SODIUM CHLORIDE 0.9 % IV BOLUS
1000.0000 mL | Freq: Once | INTRAVENOUS | Status: AC
  Administered 2018-04-16: 1000 mL via INTRAVENOUS

## 2018-04-16 NOTE — ED Provider Notes (Signed)
[facility] Emergency Department Provider Note  ____________________________________________   First MD Initiated Contact with Patient 04/16/18 [phone]     (approximate)  I have reviewed the triage vital signs and the nursing notes.   HISTORY  Chief Complaint Emesis    HPI Courtney Swanson is a 22 y.o. female with no contributory past medical history who presents for evaluation of gradual onset nausea, vomiting, and diarrhea over the course of about the last 24 hours.  She reports that she did not feel well when she woke up yesterday morning but she went to work.  She had nausea all day which developed into at least 5 episodes of vomiting over the course of the day.  Prior to calling EMS she also had an episode of diarrhea while concurrently vomiting.  She reports the symptoms are severe and nothing is making her better or worse.  She feels dehydrated.  She said that she "hurts" but when asked what that means she says she just is tired of feeling sick to her stomach.  She denies any specific abdominal pain.  She denies fever/chills, chest pain, shortness of breath, and dysuria.  She has not been around anybody else that has been sick recently and does not work in healthcare or childcare.  No recent travel.  No abnormal food exposures of which she is aware.  Past Medical History:  Diagnosis Date  . Asthma     There are no active problems to display for this patient.   History reviewed. No pertinent surgical history.  Prior to Admission medications   Medication Sig Start Date End Date Taking? Authorizing Provider  albuterol (PROVENTIL HFA;VENTOLIN HFA) 108 (90 Base) MCG/ACT inhaler Inhale 2 puffs into the lungs every 6 (six) hours as needed for wheezing or shortness of breath. 09/08/15   Beers, Charmayne Sheer, PA-C  benzonatate (TESSALON PERLES) 100 MG capsule Take 1-2 tabs TID prn cough 06/29/17   Menshew, Charlesetta Ivory, PA-C  chlorpheniramine (CHLOR-TRIMETON) 4 MG  tablet Take 1 tablet (4 mg total) by mouth 2 (two) times daily as needed for allergies or rhinitis. 09/08/15   Beers, Charmayne Sheer, PA-C  clonazePAM (KLONOPIN) 0.5 MG tablet Take 0.5 mg by mouth 2 (two) times daily.    [provider]  fluticasone (FLONASE) 50 MCG/ACT nasal spray Place 1 spray into both nostrils daily. 09/08/15 09/07/16  Beers, Charmayne Sheer, PA-C  ibuprofen (ADVIL,MOTRIN) 800 MG tablet Take 1 tablet (800 mg total) by mouth every 8 (eight) hours as needed. 07/30/16   Jennye Moccasin, MD  lidocaine (XYLOCAINE) 2 % solution Use as directed 5 mLs in the mouth or throat every 6 (six) hours as needed for mouth pain. 5 mL (felt DM for swish and swallow. 10/31/15   Joni Reining, PA-C  metoCLOPramide (REGLAN) 5 MG tablet Take 1 tablet (5 mg total) by mouth 3 (three) times daily. 02/01/16 01/31/17  Tommi Rumps, PA-C  ondansetron (ZOFRAN ODT) 4 MG disintegrating tablet Allow 1-2 tablets to dissolve in your mouth every 8 hours as needed for nausea/vomiting 04/16/18   Loleta Rose, MD  promethazine-codeine [facility] WITH CODEINE) 6.25-10 MG/5ML syrup Take 5 mLs by mouth every 4 (four) hours as needed for cough. 01/23/17   Triplett, Rulon Eisenmenger B, FNP    Allergies Amoxicillin and Tylenol [acetaminophen]  No family history on file.  Social History Social History   Tobacco Use  . Smoking status: Current Every Day Smoker    Packs/day: 0.50  Types: Cigarettes  . Smokeless tobacco: Never Used  Substance Use Topics  . Alcohol use: No  . Drug use: Never    Review of Systems Constitutional: No fever/chills Eyes: No visual changes. ENT: No sore throat. Cardiovascular: Denies chest pain. Respiratory: Denies shortness of breath. Gastrointestinal: Nausea, vomiting, and diarrhea as described above.  No specific abdominal pain. Genitourinary: Negative for dysuria. Musculoskeletal: Negative for neck pain.  Negative for back pain. Integumentary: Negative for rash. Neurological: Negative for  headaches, focal weakness or numbness.   ____________________________________________   PHYSICAL EXAM:  VITAL SIGNS: ED Triage Vitals  Enc Vitals Group     BP 04/16/18 0417 110/60     Pulse Rate 04/16/18 0417 91     Resp 04/16/18 0417 18     Temp 04/16/18 0417 97.8 F (36.6 C)     Temp Source 04/16/18 0417 Oral     SpO2 04/16/18 0417 100 %     Weight 04/16/18 0418 77.1 kg (170 lb)     Height 04/16/18 0418 1.702 m (5\' 7" )     Head Circumference --      Peak Flow --      Pain Score 04/16/18 0417 10     Pain Loc --      Pain Edu? --      Excl. in GC? --     Constitutional: Alert and oriented. Appears to feel ill and uncomfortable but is non-toxic in appearance. Eyes: Conjunctivae are normal.  Head: Atraumatic. Nose: No congestion/rhinnorhea. Mouth/Throat: Mucous membranes are dry. Neck: No stridor.  No meningeal signs.   Cardiovascular: Borderline tachycardia, regular rhythm. Good peripheral circulation. Grossly normal heart sounds. Respiratory: Normal respiratory effort.  No retractions. Lungs CTAB. Gastrointestinal: Soft and nontender in spite of severe nausea. No distention.  Musculoskeletal: No lower extremity tenderness nor edema. No gross deformities of extremities. Neurologic:  Normal speech and language. No gross focal neurologic deficits are appreciated.  Skin:  Skin is warm, dry and intact. No rash noted. Psychiatric: Mood and affect are anxious due to acute symptoms.  ____________________________________________   LABS (all labs ordered are listed, but only abnormal results are displayed)  Labs Reviewed  CBC WITH DIFFERENTIAL/PLATELET - Abnormal; Notable for the following components:      Result Value   WBC 23.3 (*)    Neutro Abs 17.8 (*)    Monocytes Absolute 1.5 (*)    Abs Immature Granulocytes 0.18 (*)    All other components within normal limits  COMPREHENSIVE METABOLIC PANEL - Abnormal; Notable for the following components:   Glucose, Bld 106 (*)      Calcium 8.8 (*)    Alkaline Phosphatase 36 (*)    Total Bilirubin 0.1 (*)    All other components within normal limits  URINALYSIS, ROUTINE W REFLEX MICROSCOPIC - Abnormal; Notable for the following components:   Color, Urine YELLOW (*)    APPearance CLEAR (*)    Ketones, ur 5 (*)    All other components within normal limits  GASTROINTESTINAL PANEL BY PCR, STOOL (REPLACES STOOL CULTURE)  C DIFFICILE QUICK SCREEN W PCR REFLEX  MAGNESIUM  LIPASE, BLOOD  PREGNANCY, URINE   ____________________________________________  EKG  No indication for EKG ____________________________________________  RADIOLOGY   ED MD interpretation:  CT abd/pelvis is pending  Official radiology report(s): No results found.  ____________________________________________   PROCEDURES  Critical Care performed: No   Procedure(s) performed:   Procedures   ____________________________________________   INITIAL IMPRESSION / ASSESSMENT AND PLAN / ED  COURSE  As part of my medical decision making, I reviewed the following data within the electronic MEDICAL RECORD NUMBER Nursing notes reviewed and incorporated, Labs reviewed , Patient signed out to Dr. Scotty Court and Notes from prior ED visits    Differential diagnosis includes, but is not limited to, viral or bacterial gastroenteritis, SBO/ileus, gastroparesis, gallbladder disease, less likely renal colic or ovarian torsion.  The patient is not complaining of pain, just severe discomfort associated with the nausea, vomiting, and recent onset of diarrhea.  Based on her description I think that a viral etiology is most likely.  I have ordered Zofran 4 mg IV and morphine 4 mg IV for comfort because she is moaning and now having dry heaves again.  I have also ordered 1 L of normal saline IV bolus.  I am checking standard labs and if the patient is able to provide a stool specimen we will send it for a GI pathogen panel.  Community-acquired C. difficile is  very unlikely but I will check that as well if we are are able to check stool studies.  No indication for acute imaging at this time.  Clinical Course as of Apr 16 709  Mon Apr 16, 2018  4782 White count of 23, otherwise labs are unremarkable.  No LFT elevation or bilirubin elevation.  CMP within normal limits.  CBC with Differential/Platelet(!) [CF]  [id number] The patient reports that she no longer feels nauseated but now she is saying that "my whole stomach hurts".  She is not able to identify a particular location although she says it feels a little bit worse on the left.  She has mild generalized tenderness to palpation throughout the abdomen but no focal tenderness, no rebound, no guarding.    Given her persistent generalized pain, N/V, and leukocytosis, I will order a CT abd/pelvis to make sure she does not have an acute bacterial infection such as appendicitis or (less likely) diverticulitis.   Transferring ED care to Dr. Scotty Court at 0700 to follow up on CT scan.  Anticipate discharge for non-specific gastroenteritis.   [CF]    Clinical Course User Index [CF] Loleta Rose, MD    ____________________________________________  FINAL CLINICAL IMPRESSION(S) / ED DIAGNOSES  Final diagnoses:  Nausea vomiting and diarrhea  Abdominal cramping     MEDICATIONS GIVEN DURING THIS VISIT:  Medications  ondansetron (ZOFRAN) injection 4 mg (4 mg Intravenous Given 04/16/18 0426)  morphine 4 MG/ML injection 4 mg (4 mg Intravenous Given 04/16/18 0427)  sodium chloride 0.9 % bolus 1,000 mL (0 mLs Intravenous Stopped 04/16/18 0650)     ED Discharge Orders         Ordered    ondansetron (ZOFRAN ODT) 4 MG disintegrating tablet     04/16/18 0710           Note:  This document was prepared using Dragon voice recognition software and may include unintentional dictation errors.    Loleta Rose, MD 04/16/18 [phone]

## 2018-04-16 NOTE — Discharge Instructions (Addendum)
Your CT scan today is unremarkable.  Your symptoms appear to be due to viral inflammation of your GI tract.  Take nausea medicine as needed to help maintain good hydration.  An over-the-counter antacid such as Pepcid or Zantac may also be helpful.

## 2018-04-16 NOTE — ED Provider Notes (Signed)
 -----------------------------------------   10:18 AM on 04/16/2018 -----------------------------------------  CT scan unremarkable.  Vital signs remain normal.  Tolerating oral intake.  Stable for discharge home symptomatic support.  Likely viral syndrome   Sharman CheekStafford, Navya Timmons, MD 04/16/18 1019

## 2018-04-16 NOTE — ED Notes (Signed)
Patient noted to be resting with eyes closed, no acute distress noted.

## 2018-04-16 NOTE — ED Triage Notes (Signed)
Pt arrives via [facility] with c/o N/V/D since 0100. Pt states that she is feeling tired at this time but is otherwise in NAD.

## 2018-04-16 NOTE — ED Notes (Signed)
Pt upset and states " I haven't had anything done" . This RN made pt aware that everything emergently has been ruled out, as well as MAR reference. PT upset being d/c. RN given d/c instructions.PT requesting wheelchair to go to lobby in.  PT A&OX4, VSS, pt waiting on family in lobby

## 2018-04-16 NOTE — ED Notes (Signed)
Patient transported to CT 

## 2018-04-16 NOTE — ED Notes (Signed)
Patient given graham crackers and ginger ale for PO challenge. Will continue to monitor for N/V/D.

## 2018-06-03 ENCOUNTER — Other Ambulatory Visit: Payer: Self-pay

## 2018-06-03 DIAGNOSIS — Z5321 Procedure and treatment not carried out due to patient leaving prior to being seen by health care provider: Secondary | ICD-10-CM | POA: Diagnosis not present

## 2018-06-03 DIAGNOSIS — M549 Dorsalgia, unspecified: Secondary | ICD-10-CM | POA: Insufficient documentation
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
## 2018-06-04 ENCOUNTER — Emergency Department
Admission: EM | Admit: 2018-06-04 | Discharge: 2018-06-04 | Discharge status: Against Medical Advice | Payer: 59 | Attending: Emergency Medicine | Admitting: Emergency Medicine

## 2019-12-12 IMAGING — CT CT ABD-PELV W/ CM
2 of 4 series · 15 of 46 positions shown, 17 images · IV contrast (iopamidol)
Comparison: June 23, 2015

CLINICAL DATA: Nausea and vomiting with elevated white blood cell
count

EXAM:
CT ABDOMEN AND PELVIS WITH CONTRAST
TECHNIQUE: Multidetector CT imaging of the abdomen and pelvis was performed
using the standard protocol following bolus administration of
intravenous contrast.
CONTRAST:  100mL [id number] IOPAMIDOL ([id number]) INJECTION 61%

[Series 2: routine abd/pel with · axial · 0.65mm/px · z∈[-993,-553]mm · 12 of 96 slices shown, 14 images]
[im 4/96  soft-tissue]
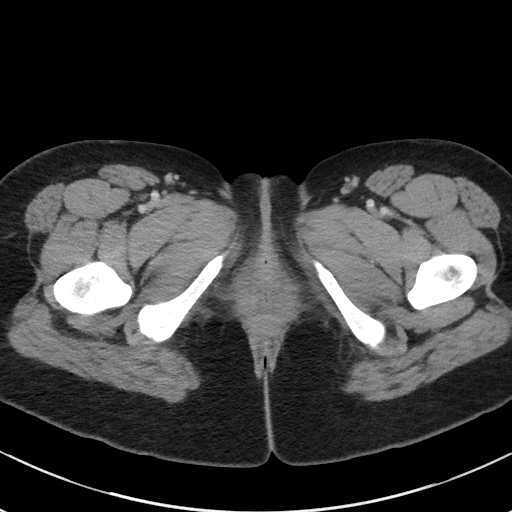
[im 4/96  bone]
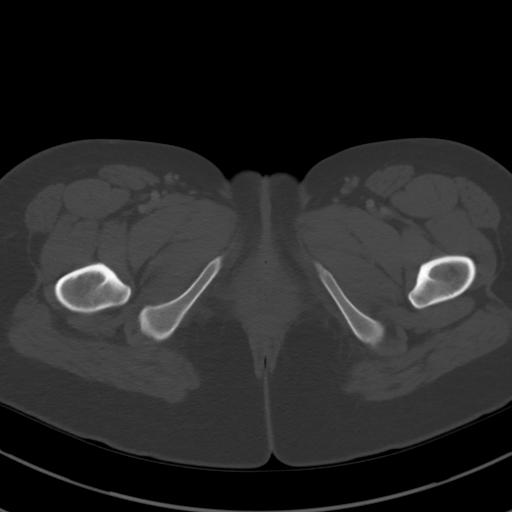
[im 12/96  soft-tissue]
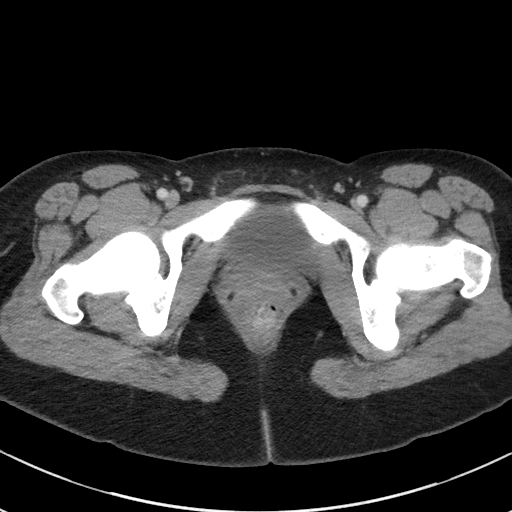
[im 20/96  soft-tissue]
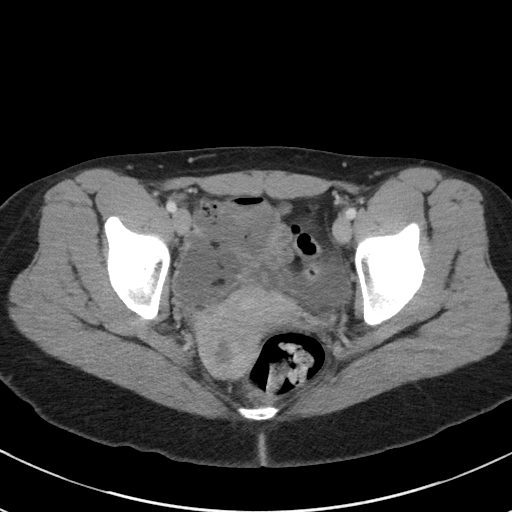
[im 28/96  soft-tissue]
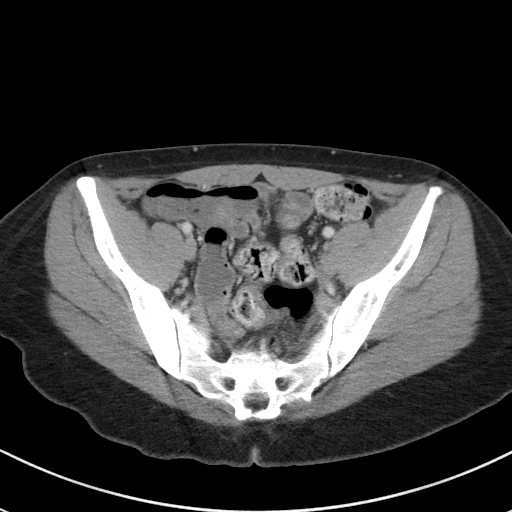
[im 36/96  soft-tissue]
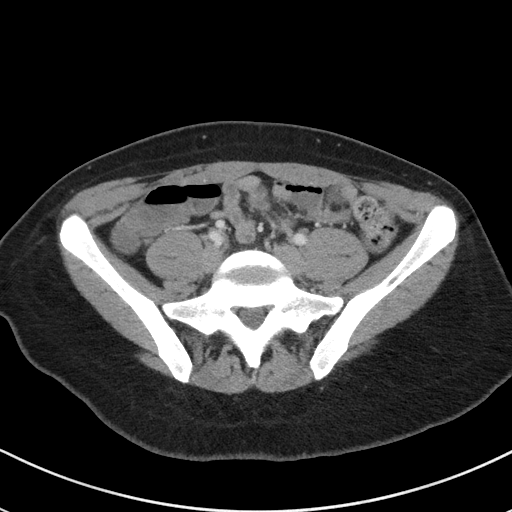
[im 44/96  soft-tissue]
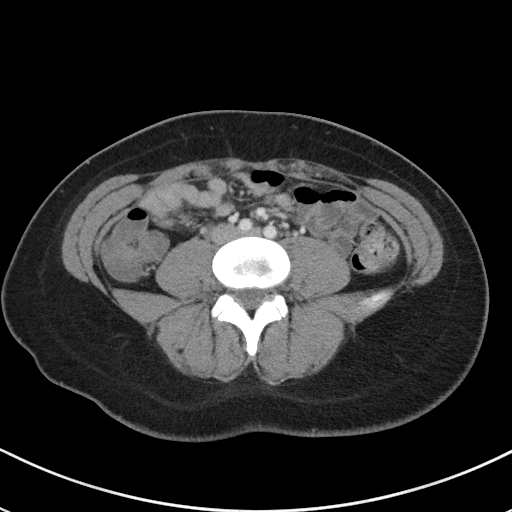
[im 52/96  soft-tissue]
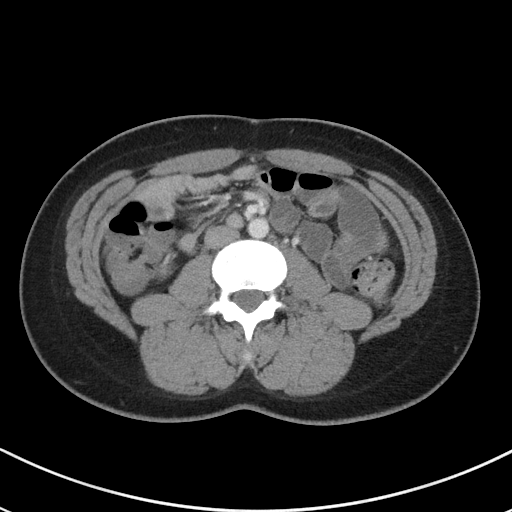
[im 60/96  soft-tissue]
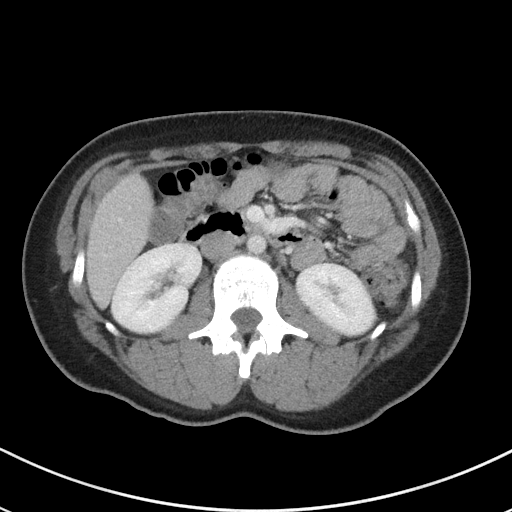
[im 68/96  soft-tissue]
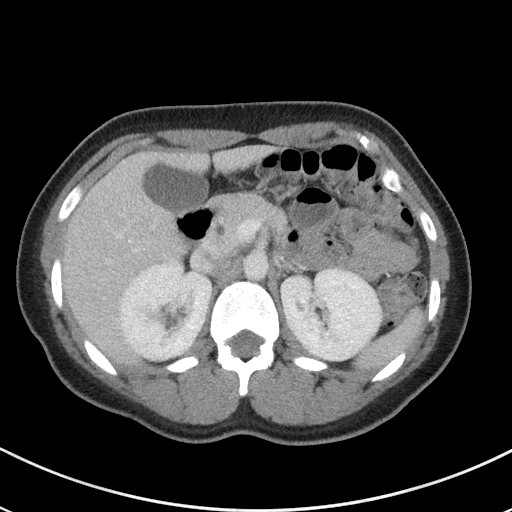
[im 68/96  bone]
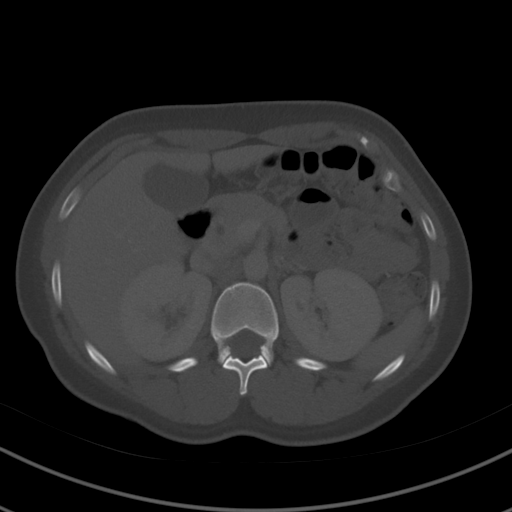
[im 76/96  soft-tissue]
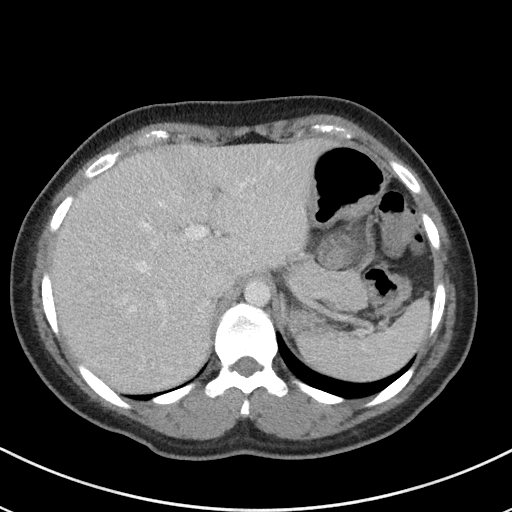
[im 84/96  soft-tissue]
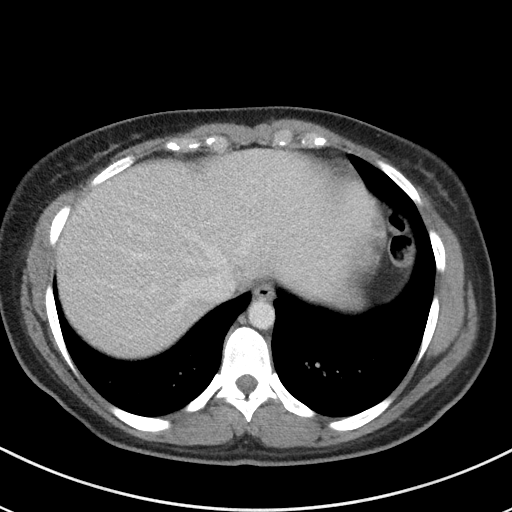
[im 92/96  soft-tissue]
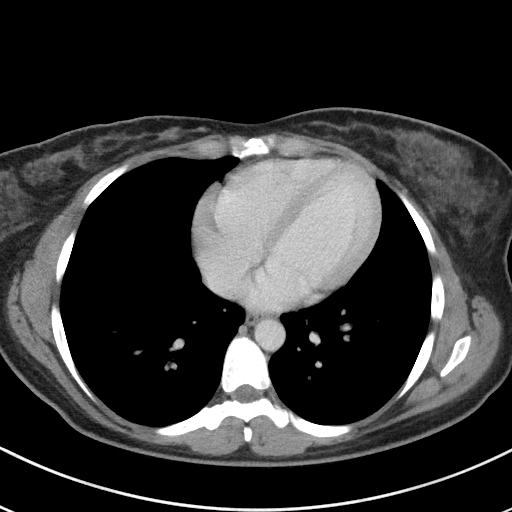

[Series 5: coronal st · coronal · 0.65mm/px · 3 of 81 slices shown]
[im 27/81  soft-tissue]
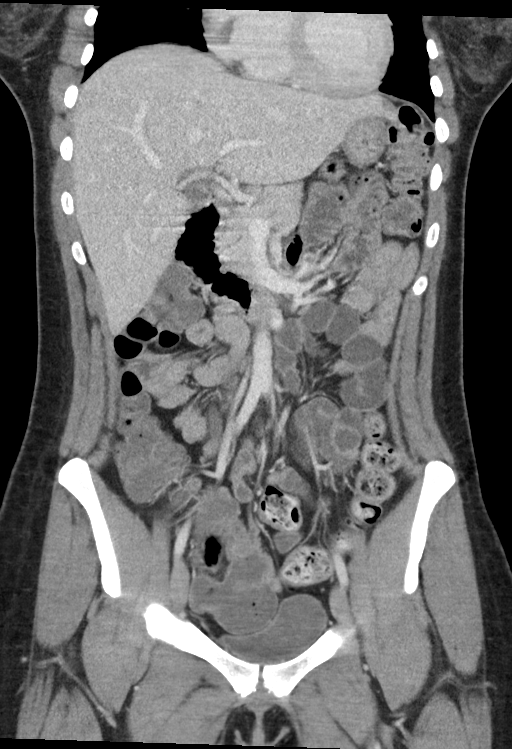
[im 36/81  soft-tissue]
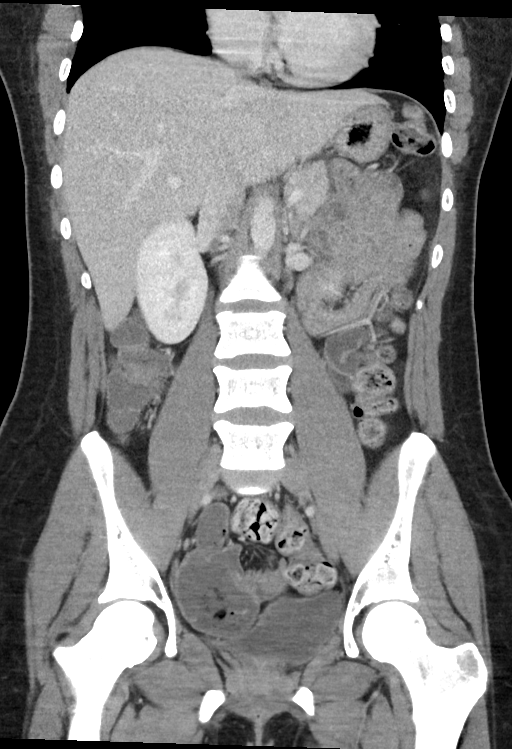
[im 45/81  soft-tissue]
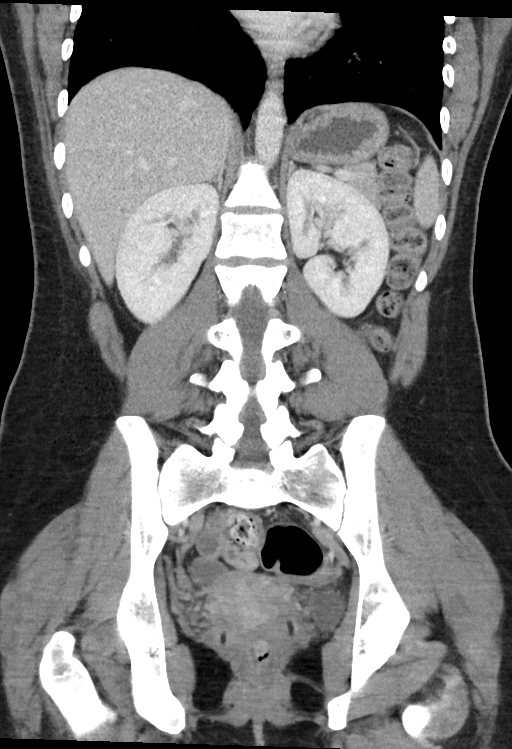

[15 of 46 positions shown; findings below may reference images not displayed]

FINDINGS: Lower chest: Lung bases are clear.

Hepatobiliary: There is fatty infiltration near the fissure for the
ligamentum teres. No focal liver lesions are evident. There is no
appreciable gallbladder wall thickening. There is no biliary duct
dilatation.

Pancreas: No pancreatic mass or inflammatory focus.

Spleen: No splenic lesions evident.

Adrenals/Urinary Tract: Adrenals appear normal bilaterally. Kidneys
bilaterally show no evident mass or hydronephrosis on either side.
There is no evident renal or ureteral calculus. No hydronephrosis.
Urinary bladder is midline with wall thickness within normal limits.

Stomach/Bowel: There is no appreciable bowel wall or mesenteric
thickening. There is no evident bowel obstruction. There is no free
air or portal venous air.

Vascular/Lymphatic: There is no appreciable abdominal aortic
aneurysm. No vascular lesions are evident. There is no evident
adenopathy in the abdomen or pelvis.

Reproductive: Uterus is retroverted. There is no appreciable pelvic
mass.

Other: No periappendiceal region inflammation is evident. There is
no abscess or ascites in the abdomen or pelvis.

Musculoskeletal: There are no blastic or lytic bone lesions. There
is no intramuscular lesion.
IMPRESSION: 1. No demonstrable bowel obstruction. No abscess in the abdomen or
pelvis. No periappendiceal region inflammation evident.

2.  No evident renal or ureteral calculus.  No hydronephrosis.

3.  Retroverted uterus.

## 2020-11-24 ENCOUNTER — Encounter: Payer: Self-pay | Admitting: Emergency Medicine

## 2020-11-24 ENCOUNTER — Other Ambulatory Visit: Payer: Self-pay

## 2020-11-24 ENCOUNTER — Emergency Department
Admission: EM | Admit: 2020-11-24 | Discharge: 2020-11-24 | Disposition: A | Discharge status: Home / Self Care | Payer: Medicaid Other | Attending: Emergency Medicine | Admitting: Emergency Medicine

## 2020-11-24 DIAGNOSIS — F17210 Nicotine dependence, cigarettes, uncomplicated: Secondary | ICD-10-CM | POA: Insufficient documentation

## 2020-11-24 DIAGNOSIS — J45909 Unspecified asthma, uncomplicated: Secondary | ICD-10-CM | POA: Diagnosis not present

## 2020-11-24 DIAGNOSIS — T7840XA Allergy, unspecified, initial encounter: Secondary | ICD-10-CM | POA: Insufficient documentation

## 2020-11-24 MED ORDER — ALBUTEROL SULFATE (2.5 MG/3ML) 0.083% IN NEBU
5.0000 mg | INHALATION_SOLUTION | Freq: Once | RESPIRATORY_TRACT | Status: AC
  Administered 2020-11-24: 04:00:00 5 mg via RESPIRATORY_TRACT
  Filled 2020-11-24: qty 6

## 2020-11-24 MED ORDER — DIPHENHYDRAMINE HCL 50 MG/ML IJ SOLN
50.0000 mg | Freq: Once | INTRAMUSCULAR | Status: AC
  Administered 2020-11-24: 04:00:00 50 mg via INTRAVENOUS
  Filled 2020-11-24: qty 1

## 2020-11-24 MED ORDER — EPINEPHRINE 0.3 MG/0.3ML IJ SOAJ: 0.3000 mg | INTRAMUSCULAR | 0 refills | Status: AC | PRN

## 2020-11-24 MED ORDER — METHYLPREDNISOLONE SODIUM SUCC 125 MG IJ SOLR
125.0000 mg | Freq: Once | INTRAMUSCULAR | Status: AC
  Administered 2020-11-24: 04:00:00 125 mg via INTRAVENOUS
  Filled 2020-11-24: qty 2

## 2020-11-24 MED ORDER — FAMOTIDINE IN NACL 20-0.9 MG/50ML-% IV SOLN
20.0000 mg | Freq: Once | INTRAVENOUS | Status: AC
  Administered 2020-11-24: 04:00:00 20 mg via INTRAVENOUS
  Filled 2020-11-24: qty 50

## 2020-11-24 NOTE — ED Notes (Signed)
Patient is sleeping. Chest rise and fall observed. No resp distress noted.

## 2020-11-24 NOTE — ED Notes (Signed)
MD at the bedside  

## 2020-11-24 NOTE — ED Triage Notes (Addendum)
EMS brings pt in from home for allergic rx to unknown bite; pt reports being outside, feeling a bite from a black bug then onset CP & generalized itching and hives

## 2020-11-24 NOTE — ED Provider Notes (Signed)
[facility] Emergency Department Provider Note  ____________________________________________  Time seen: Approximately 4:35 AM  I have reviewed the triage vital signs and the nursing notes.   HISTORY  Chief Complaint Allergic Reaction   HPI Courtney Swanson is a 25 y.o. female with a history of asthma who presents for evaluation of an allergic reaction.  Patient reports that she was playing outside with her son when she felt a bug bite her on her right ankle.  She then broke out in hives.  She was having difficulty breathing and heaviness/tightness in her chest.  Had some nausea but no vomiting.  Took some Benadryl at home with no significant relief.  No throat closing sensation.  Past Medical History:  Diagnosis Date   Asthma     There are no problems to display for this patient.   History reviewed. No pertinent surgical history.  Prior to Admission medications   Medication Sig Start Date End Date Taking? Authorizing Provider  EPINEPHrine 0.3 mg/0.3 mL IJ SOAJ injection Inject 0.3 mg into the muscle as needed for anaphylaxis. 11/24/20  Yes Don Perking, [place], MD  albuterol (PROVENTIL HFA;VENTOLIN HFA) 108 (90 Base) MCG/ACT inhaler Inhale 2 puffs into the lungs every 6 (six) hours as needed for wheezing or shortness of breath. 09/08/15   Beers, Charmayne Sheer, PA-C  benzonatate (TESSALON PERLES) 100 MG capsule Take 1-2 tabs TID prn cough 06/29/17   Menshew, Charlesetta Ivory, PA-C  chlorpheniramine (CHLOR-TRIMETON) 4 MG tablet Take 1 tablet (4 mg total) by mouth 2 (two) times daily as needed for allergies or rhinitis. 09/08/15   Beers, Charmayne Sheer, PA-C  clonazePAM (KLONOPIN) 0.5 MG tablet Take 0.5 mg by mouth 2 (two) times daily.    [provider]  fluticasone (FLONASE) 50 MCG/ACT nasal spray Place 1 spray into both nostrils daily. 09/08/15 09/07/16  Beers, Charmayne Sheer, PA-C  ibuprofen (ADVIL,MOTRIN) 800 MG tablet Take 1 tablet (800 mg total) by mouth every 8  (eight) hours as needed. 07/30/16   Jennye Moccasin, MD  lidocaine (XYLOCAINE) 2 % solution Use as directed 5 mLs in the mouth or throat every 6 (six) hours as needed for mouth pain. 5 mL (felt DM for swish and swallow. 10/31/15   Joni Reining, PA-C  metoCLOPramide (REGLAN) 5 MG tablet Take 1 tablet (5 mg total) by mouth 3 (three) times daily. 02/01/16 01/31/17  Tommi Rumps, PA-C  ondansetron (ZOFRAN ODT) 4 MG disintegrating tablet Allow 1-2 tablets to dissolve in your mouth every 8 hours as needed for nausea/vomiting 04/16/18   Loleta Rose, MD  promethazine-codeine [facility] WITH CODEINE) 6.25-10 MG/5ML syrup Take 5 mLs by mouth every 4 (four) hours as needed for cough. 01/23/17   Triplett, Rulon Eisenmenger B, FNP    Allergies Amoxicillin and Tylenol [acetaminophen]  No family history on file.  Social History Social History   Tobacco Use   Smoking status: Every Day    Packs/day: 0.50    Pack years: 0.00    Types: Cigarettes   Smokeless tobacco: Never  Vaping Use   Vaping Use: Never used  Substance Use Topics   Alcohol use: No   Drug use: Never    Review of Systems  Constitutional: Negative for fever. Eyes: Negative for visual changes. ENT: Negative for sore throat. Neck: No neck pain  Cardiovascular: Negative for chest pain. Respiratory: + shortness of breath. Gastrointestinal: Negative for abdominal pain, vomiting or diarrhea. Genitourinary: Negative for dysuria. Musculoskeletal: Negative for back pain. Skin:  Negative for rash. + hives Neurological: Negative for headaches, weakness or numbness. Psych: No SI or HI  ____________________________________________   PHYSICAL EXAM:  VITAL SIGNS: ED Triage Vitals  Enc Vitals Group     BP 11/24/20 0212 134/64     Pulse Rate 11/24/20 0212 87     Resp 11/24/20 0212 18     Temp 11/24/20 0212 98.6 F (37 C)     Temp Source 11/24/20 0212 Oral     SpO2 11/24/20 0207 96 %     Weight 11/24/20 0212 165 lb (74.8 kg)     Height  11/24/20 0212 5\' 7"  (1.702 m)     Head Circumference --      Peak Flow --      Pain Score 11/24/20 0213 0     Pain Loc --      Pain Edu? --      Excl. in [place]? --     Constitutional: Alert and oriented. Well appearing and in no apparent distress. HEENT:      Head: Normocephalic and atraumatic.         Eyes: Conjunctivae are normal. Sclera is non-icteric.       Mouth/Throat: Mucous membranes are moist.  No angioedema, tongue and uvula are normal with no swelling.      Neck: Supple with no signs of meningismus. Cardiovascular: Regular rate and rhythm. No murmurs, gallops, or rubs. 2+ symmetrical distal pulses are present in all extremities. No JVD. Respiratory: Normal respiratory effort. Lungs are clear to auscultation bilaterally.  Gastrointestinal: Soft, non tender. Musculoskeletal:  No edema, cyanosis, or erythema of extremities. Neurologic: Normal speech and language. Face is symmetric. Moving all extremities. No gross focal neurologic deficits are appreciated. Skin: Skin is warm, dry and intact.  Hives on torso and extremities Psychiatric: Mood and affect are normal. Speech and behavior are normal.  ____________________________________________   LABS (all labs ordered are listed, but only abnormal results are displayed)  Labs Reviewed - No data to display ____________________________________________  EKG  none  ____________________________________________  RADIOLOGY  none  ____________________________________________   PROCEDURES  Procedure(s) performed: None Procedures Critical Care performed:  None ____________________________________________   INITIAL IMPRESSION / ASSESSMENT AND PLAN / ED COURSE  25 y.o. female with a history of asthma who presents for evaluation of an allergic reaction to an insect bite.  No signs of anaphylaxis.  Patient will be treated with IV Pepcid, Benadryl, and Solu-Medrol.  She has no wheezing or respiratory distress but is complaining  of chest tightness and shortness of breath.  With history of asthma we will give 5 mg of albuterol nebulizer.  Will monitor on telemetry for any signs of anaphylaxis.  Plan to discharge home with an EpiPen and follow-up with PCP.  _________________________ 6:16 AM on 11/24/2020 ----------------------------------------- Patient monitored for several hours with no recurrence of her symptoms.  No signs of anaphylaxis.  Will discharge home on supportive care.  EpiPen prescription provided.  Discussed my standard return precautions.    _____________________________________________ Please note:  Patient was evaluated in Emergency Department today for the symptoms described in the history of present illness. Patient was evaluated in the context of the global COVID-19 pandemic, which necessitated consideration that the patient might be at risk for infection with the SARS-CoV-2 virus that causes COVID-19. Institutional protocols and algorithms that pertain to the evaluation of patients at risk for COVID-19 are in a state of rapid change based on information released by regulatory bodies including the CDC and  federal and state organizations. These policies and algorithms were followed during the patient's care in the ED.  Some ED evaluations and interventions may be delayed as a result of limited staffing during the pandemic.   [place] Controlled Substance Database was reviewed by me. ____________________________________________   FINAL CLINICAL IMPRESSION(S) / ED DIAGNOSES   Final diagnoses:  Allergic reaction, initial encounter      NEW MEDICATIONS STARTED DURING THIS VISIT:  ED Discharge Orders          Ordered    EPINEPHrine 0.3 mg/0.3 mL IJ SOAJ injection  As needed        11/24/20 9201             Note:  This document was prepared using Dragon voice recognition software and may include unintentional dictation errors.    Don Perking, [place], MD 11/24/20 [phone]

## 2021-07-05 ENCOUNTER — Ambulatory Visit: Payer: Medicaid Other

## 2021-07-11 ENCOUNTER — Encounter: Payer: Self-pay | Admitting: Emergency Medicine

## 2021-07-11 ENCOUNTER — Other Ambulatory Visit: Payer: Self-pay

## 2021-07-11 ENCOUNTER — Emergency Department
Admission: EM | Admit: 2021-07-11 | Discharge: 2021-07-11 | Disposition: A | Discharge status: Home / Self Care | Payer: Medicaid Other | Attending: Emergency Medicine | Admitting: Emergency Medicine

## 2021-07-11 ENCOUNTER — Ambulatory Visit
Admission: EM | Admit: 2021-07-11 | Discharge: 2021-07-11 | Disposition: A | Discharge status: Home / Self Care | Payer: Medicaid Other | Attending: Family Medicine | Admitting: Family Medicine

## 2021-07-11 DIAGNOSIS — D72829 Elevated white blood cell count, unspecified: Secondary | ICD-10-CM | POA: Diagnosis not present

## 2021-07-11 DIAGNOSIS — R112 Nausea with vomiting, unspecified: Secondary | ICD-10-CM | POA: Diagnosis not present

## 2021-07-11 DIAGNOSIS — R1115 Cyclical vomiting syndrome unrelated to migraine: Secondary | ICD-10-CM | POA: Insufficient documentation

## 2021-07-11 DIAGNOSIS — R300 Dysuria: Secondary | ICD-10-CM | POA: Insufficient documentation

## 2021-07-11 DIAGNOSIS — N390 Urinary tract infection, site not specified: Secondary | ICD-10-CM | POA: Insufficient documentation

## 2021-07-11 DIAGNOSIS — R1030 Lower abdominal pain, unspecified: Secondary | ICD-10-CM | POA: Diagnosis present

## 2021-07-11 LAB — POCT URINE PREGNANCY: Preg Test, Ur: NEGATIVE

## 2021-07-11 LAB — CBC
HCT: 45.2 % (ref 36.0–46.0)
Hemoglobin: 14.4 g/dL (ref 12.0–15.0)
MCH: 29 pg (ref 26.0–34.0)
MCHC: 31.9 g/dL (ref 30.0–36.0)
MCV: 90.9 fL (ref 80.0–100.0)
Platelets: 326 10*3/uL (ref 150–400)
RBC: 4.97 MIL/uL (ref 3.87–5.11)
RDW: 13.3 % (ref 11.5–15.5)
WBC: 10.5 10*3/uL (ref 4.0–10.5)
nRBC: 0 % (ref 0.0–0.2)

## 2021-07-11 LAB — POCT URINALYSIS DIP (MANUAL ENTRY)
Blood, UA: NEGATIVE
Glucose, UA: 250 mg/dL — AB
Nitrite, UA: POSITIVE — AB
Protein Ur, POC: 100 mg/dL — AB
Spec Grav, UA: <=1.005 — AB (ref 1.010–1.025)
Urobilinogen, UA: 4 E.U./dL — AB
pH, UA: 5 (ref 5.0–8.0)

## 2021-07-11 LAB — COMPREHENSIVE METABOLIC PANEL
ALT: 11 U/L (ref 0–44)
AST: 13 U/L — ABNORMAL LOW (ref 15–41)
Albumin: 4.3 g/dL (ref 3.5–5.0)
Alkaline Phosphatase: 53 U/L (ref 38–126)
Anion gap: 9 (ref 5–15)
BUN: 7 mg/dL (ref 6–20)
CO2: 29 mmol/L (ref 22–32)
Calcium: 9.7 mg/dL (ref 8.9–10.3)
Chloride: 100 mmol/L (ref 98–111)
Creatinine, Ser: 0.72 mg/dL (ref 0.44–1.00)
GFR, Estimated: >60 mL/min (ref >60)
Glucose, Bld: 103 mg/dL — ABNORMAL HIGH (ref 70–99)
Potassium: 3.6 mmol/L (ref 3.5–5.1)
Sodium: 138 mmol/L (ref 135–145)
Total Bilirubin: 0.7 mg/dL (ref 0.3–1.2)
Total Protein: 8.5 g/dL — ABNORMAL HIGH (ref 6.5–8.1)

## 2021-07-11 LAB — LIPASE, BLOOD: Lipase: 29 U/L (ref 11–51)

## 2021-07-11 MED ORDER — NITROFURANTOIN MONOHYD MACRO 100 MG PO CAPS
100.0000 mg | ORAL_CAPSULE | Freq: Two times a day (BID) | ORAL | 0 refills | Status: DC
Start: 2021-07-11 — End: 2021-09-21

## 2021-07-11 MED ORDER — SODIUM CHLORIDE 0.9 % IV SOLN
1.0000 g | Freq: Once | INTRAVENOUS | Status: AC
  Administered 2021-07-11: 1 g via INTRAVENOUS
  Filled 2021-07-11: qty 10

## 2021-07-11 MED ORDER — ONDANSETRON 4 MG PO TBDP
4.0000 mg | ORAL_TABLET | Freq: Once | ORAL | Status: AC
  Administered 2021-07-11: 4 mg via ORAL

## 2021-07-11 MED ORDER — ONDANSETRON HCL 4 MG/2ML IJ SOLN
4.0000 mg | Freq: Once | INTRAMUSCULAR | Status: AC
  Administered 2021-07-11: 4 mg via INTRAVENOUS
  Filled 2021-07-11: qty 2

## 2021-07-11 MED ORDER — PHENAZOPYRIDINE HCL 200 MG PO TABS: 200.0000 mg | ORAL_TABLET | Freq: Three times a day (TID) | ORAL | 0 refills | Status: DC | PRN

## 2021-07-11 MED ORDER — METOCLOPRAMIDE HCL 5 MG/ML IJ SOLN
10.0000 mg | Freq: Once | INTRAMUSCULAR | Status: AC
  Administered 2021-07-11: 10 mg via INTRAMUSCULAR

## 2021-07-11 MED ORDER — FENTANYL CITRATE PF 50 MCG/ML IJ SOSY
50.0000 ug | PREFILLED_SYRINGE | Freq: Once | INTRAMUSCULAR | Status: AC
  Administered 2021-07-11: 50 ug via INTRAVENOUS
  Filled 2021-07-11: qty 1

## 2021-07-11 MED ORDER — KETOROLAC TROMETHAMINE 30 MG/ML IJ SOLN
15.0000 mg | Freq: Once | INTRAMUSCULAR | Status: AC
  Administered 2021-07-11: 15 mg via INTRAVENOUS
  Filled 2021-07-11: qty 1

## 2021-07-11 MED ORDER — CEPHALEXIN 500 MG PO CAPS: 500.0000 mg | ORAL_CAPSULE | Freq: Three times a day (TID) | ORAL | 0 refills | Status: DC

## 2021-07-11 MED ORDER — METOCLOPRAMIDE HCL 5 MG PO TABS: 5.0000 mg | ORAL_TABLET | Freq: Three times a day (TID) | ORAL | 0 refills | Status: DC

## 2021-07-11 NOTE — ED Triage Notes (Signed)
Pt via POV from home. Pt c/o lower abd pain and nausea. Pt was seen at [facility] and was told to come to the ER. Urine was done at [facility]. Pt was giving ODT Zofran at 4:15. Pt is A&OX4 and NAD.

## 2021-07-11 NOTE — ED Notes (Signed)
RN to bedside to introduce self to patient and initiate patient care, Pt lying in bed with blankets. CAOX4 and in no acute distress.

## 2021-07-11 NOTE — Discharge Instructions (Addendum)
High recommend that you go to the ER given nausea, vomiting, abdominal pain in the presence of urinary tract infection. Follow-up with your GI doctor at [facility] regarding your GI meds. Follow-up with your PCP regarding referrals.

## 2021-07-11 NOTE — ED Triage Notes (Signed)
Pt presents with urinary frequency x 3 days. She has abdominal pain and nausea since today.

## 2021-07-11 NOTE — ED Provider Notes (Signed)
[facility]    CSN: [id number] Arrival date & time: 07/11/21  1521      History   Chief Complaint No chief complaint on file.   HPI Courtney Swanson is a 26 y.o. female.   HPI Patient present for evaluation of UTI.  Symptoms presents x 3 days. Complains of associated abdominal pain and nausea.  Patient became nauseous while in clinic and received a dose of Zofran. She subsequently began to persistently vomit and complain of abdominal pain. Patient's vital signs are stable.  Patient does have a history of narcotic abuse and reports she has been off of her Suboxone. Past Medical History:  Diagnosis Date   Asthma     There are no problems to display for this patient.   No past surgical history on file.  OB History   No obstetric history on file.      Home Medications    Prior to Admission medications   Medication Sig Start Date End Date Taking? Authorizing Provider  albuterol (PROVENTIL HFA;VENTOLIN HFA) 108 (90 Base) MCG/ACT inhaler Inhale 2 puffs into the lungs every 6 (six) hours as needed for wheezing or shortness of breath. 09/08/15   Beers, Pierce Crane, PA-C  benzonatate (TESSALON PERLES) 100 MG capsule Take 1-2 tabs TID prn cough 06/29/17   Menshew, Dannielle Karvonen, PA-C  chlorpheniramine (CHLOR-TRIMETON) 4 MG tablet Take 1 tablet (4 mg total) by mouth 2 (two) times daily as needed for allergies or rhinitis. 09/08/15   Beers, Pierce Crane, PA-C  clonazePAM (KLONOPIN) 0.5 MG tablet Take 0.5 mg by mouth 2 (two) times daily.    [provider]  EPINEPHrine 0.3 mg/0.3 mL IJ SOAJ injection Inject 0.3 mg into the muscle as needed for anaphylaxis. 11/24/20   Rudene Re, MD  fluticasone [facility]) 50 MCG/ACT nasal spray Place 1 spray into both nostrils daily. 09/08/15 09/07/16  Beers, Pierce Crane, PA-C  ibuprofen (ADVIL,MOTRIN) 800 MG tablet Take 1 tablet (800 mg total) by mouth every 8 (eight) hours as needed. 07/30/16   Daymon Larsen, MD  lidocaine  (XYLOCAINE) 2 % solution Use as directed 5 mLs in the mouth or throat every 6 (six) hours as needed for mouth pain. 5 mL (felt DM for swish and swallow. 10/31/15   Sable Feil, PA-C  metoCLOPramide (REGLAN) 5 MG tablet Take 1 tablet (5 mg total) by mouth 3 (three) times daily. 02/01/16 01/31/17  Johnn Hai, PA-C  ondansetron (ZOFRAN ODT) 4 MG disintegrating tablet Allow 1-2 tablets to dissolve in your mouth every 8 hours as needed for nausea/vomiting 04/16/18   Hinda Kehr, MD  promethazine-codeine [facility] WITH CODEINE) 6.25-10 MG/5ML syrup Take 5 mLs by mouth every 4 (four) hours as needed for cough. 01/23/17   Triplett, Dessa Phi, FNP    Family History No family history on file.  Social History Social History   Tobacco Use   Smoking status: Every Day    Packs/day: 0.50    Types: Cigarettes   Smokeless tobacco: Never  Vaping Use   Vaping Use: Never used  Substance Use Topics   Alcohol use: No   Drug use: Never     Allergies   Amoxicillin and Tylenol [acetaminophen]   Review of Systems Review of Systems Pertinent negatives listed in HPI  Physical Exam Triage Vital Signs ED Triage Vitals  Enc Vitals Group     BP      Pulse      Resp  Temp      Temp src      SpO2      Weight      Height      Head Circumference      Peak Flow      Pain Score      Pain Loc      Pain Edu?      Excl. in [place]?    No data found.  Updated Vital Signs BP 134/88 (BP Location: Left Arm)    Pulse 74    Temp 98.2 F (36.8 C) (Oral)    Resp 20    LMP 06/28/2021    SpO2 97%   Visual Acuity Right Eye Distance:   Left Eye Distance:   Bilateral Distance:    Right Eye Near:   Left Eye Near:    Bilateral Near:     Physical Exam Constitutional:      Appearance: She is ill-appearing. She is not toxic-appearing.  HENT:     Head: Normocephalic and atraumatic.  Cardiovascular:     Rate and Rhythm: Normal rate and regular rhythm.  Pulmonary:     Effort: Pulmonary effort is  normal.  Abdominal:     Comments: Patient projectile vomiting requiring wheelchair to get off the table abdominal exam omitted  Neurological:     General: No focal deficit present.     Mental Status: She is alert and oriented to person, place, and time.  Psychiatric:        Mood and Affect: Mood normal.        Behavior: Behavior normal.     UC Treatments / Results  Labs (all labs ordered are listed, but only abnormal results are displayed) Labs Reviewed - No data to display  EKG   Radiology No results found.  Procedures Procedures (including critical care time)  Medications Ordered in UC Medications - No data to display  Initial Impression / Assessment and Plan / UC Course  I have reviewed the triage vital signs and the nursing notes.  Pertinent labs & imaging results that were available during my care of the patient were reviewed by me and considered in my medical decision making (see chart for details).    Advised patient's caregiver to take her directly to the ER.  Advised that patient could have pyelonephritis or an acute abdomen given the fact that she is persistently vomiting and complaining of 10 out of 10 abdominal pain. Prescribed Macrobid as patient has a urinary tract infection.  Urine culture is pending.  Also prescribed additional doses of Zofran for nausea. Final Clinical Impressions(s) / UC Diagnoses   Final diagnoses:  Dysuria  Acute urinary tract infection  Persistent vomiting   Discharge Instructions   None    ED Prescriptions     Medication Sig Dispense Auth. Provider   nitrofurantoin, macrocrystal-monohydrate, (MACROBID) 100 MG capsule Take 1 capsule (100 mg total) by mouth 2 (two) times daily. 20 capsule Scot Jun, FNP   metoCLOPramide (REGLAN) 5 MG tablet Take 1 tablet (5 mg total) by mouth 3 (three) times daily before meals. 20 tablet Scot Jun, FNP      PDMP not reviewed this encounter.   Scot Jun,  [place] 07/11/21 [phone]

## 2021-07-11 NOTE — ED Provider Notes (Signed)
[facility] Provider Note    Event Date/Time   First MD Initiated Contact with Patient 07/11/21 1736     (approximate)  History   Chief Complaint: Abdominal Pain  HPI  Courtney Swanson is a 26 y.o. female with no significant past medical history who presents emergency department for lower abdominal discomfort and urinary frequency.  Patient states she has many UTIs in the past and this feels identical to her past urinary tract infections.  Patient denies any nausea vomiting or diarrhea.  Denies any vaginal bleeding or discharge.  No new sexual partners x5 years.  No concern for STD.  Patient was seen at the urgent care had a urinalysis showing significant amount of leukocytes as well as nitrite positive was referred to the emergency department for further evaluation given her discomfort.  No fever.  Physical Exam   Triage Vital Signs: ED Triage Vitals  Enc Vitals Group     BP 07/11/21 1657 (!) 122/91     Pulse Rate 07/11/21 1657 76     Resp 07/11/21 1657 16     Temp 07/11/21 1657 98 F (36.7 C)     Temp Source 07/11/21 1657 Oral     SpO2 07/11/21 1657 99 %     Weight 07/11/21 1655 165 lb (74.8 kg)     Height 07/11/21 1655 5\' 7"  (1.702 m)     Head Circumference --      Peak Flow --      Pain Score 07/11/21 1655 10     Pain Loc --      Pain Edu? --      Excl. in [place]? --     Most recent vital signs: Vitals:   07/11/21 1657 07/11/21 1807  BP: (!) 122/91 127/83  Pulse: 76 80  Resp: 16 16  Temp: 98 F (36.7 C)   SpO2: 99% 99%    General: Awake, no distress.  CV:  Good peripheral perfusion.  Regular rate and rhythm  Resp:  Normal effort.  Equal breath sounds bilaterally.  Abd:  No distention.  Soft, mild suprapubic tenderness otherwise benign abdomen.    MEDICATIONS ORDERED IN ED: Medications  cefTRIAXone (ROCEPHIN) 1 g in sodium chloride 0.9 % 100 mL IVPB (1 g Intravenous New Bag/Given 07/11/21 1807)  fentaNYL (SUBLIMAZE) injection 50 mcg  (50 mcg Intravenous Given 07/11/21 1807)  ketorolac (TORADOL) 30 MG/ML injection 15 mg (15 mg Intravenous Given 07/11/21 1807)  ondansetron (ZOFRAN) injection 4 mg (4 mg Intravenous Given 07/11/21 1807)     IMPRESSION / MDM / ASSESSMENT AND PLAN / ED COURSE  I reviewed the triage vital signs and the nursing notes.  Patient presents emergency department for lower abdominal pain.  Patient states moderate discomfort which occurs with her more significant urinary tract infections.  I reviewed the urinalysis from the urgent care which appears to show leukocytes nitrite positive highly suggestive of urinary tract infection.  Reassuringly patient's remainder of the lab work is reassuring including normal CBC, normal chemistry with normal renal function.  We will treat the patient with IV Rocephin in the emergency department, we will treat the patient's pain and nausea as well as IV hydrate while awaiting antibiotic infusion.  Patient is feeling better after medications.  We will discharge the patient on Keflex 3 times daily for the next 10 days have the patient follow-up with her doctor next week for recheck/reevaluation.  Discussed return precautions.  FINAL CLINICAL IMPRESSION(S) / ED DIAGNOSES  Urinary tract infection  Rx / DC Orders   Keflex Pyridium  Note:  This document was prepared using Dragon voice recognition software and may include unintentional dictation errors.   Harvest Dark, MD 07/11/21 1815

## 2021-07-13 LAB — URINE CULTURE
Culture: 60000 — AB
Special Requests: NORMAL

## 2021-07-28 ENCOUNTER — Other Ambulatory Visit: Payer: Self-pay

## 2021-07-28 ENCOUNTER — Ambulatory Visit (LOCAL_COMMUNITY_HEALTH_CENTER): Payer: Medicaid Other

## 2021-07-28 VITALS — BP 109/67 | Ht 67.0 in | Wt 151.0 lb

## 2021-07-28 DIAGNOSIS — Z3201 Encounter for pregnancy test, result positive: Secondary | ICD-10-CM | POA: Diagnosis not present

## 2021-07-28 LAB — PREGNANCY, URINE: Preg Test, Ur: POSITIVE — AB

## 2021-07-28 MED ORDER — PRENATAL 27-0.8 MG PO TABS: 1.0000 | ORAL_TABLET | Freq: Every day | ORAL | 0 refills | Status: AC

## 2021-07-28 NOTE — Progress Notes (Addendum)
UPT positive. Plans prenatal care at [facility]. Takes suboxone daily d/t hx substance use disorder. Prescribed by [facility].  ? ?Consult Elveria Rising, FNP who orders RN to counsel pt to continue taking suboxone until she contacts prescribing provider for advisement regarding suboxone while preg.  ?RN  carried out provider orders. Pt in agreement and plans to contact [facility] today. Sent to clerk for preadmit. Jerel Shepherd, RN ? ?

## 2021-08-01 NOTE — Progress Notes (Signed)
Consulted by RN re: patient situation.  Reviewed RN note and agree that it reflects our discussion and my recommendations.  ° ° °Grecia Lynk, FNP  °

## 2021-08-06 ENCOUNTER — Other Ambulatory Visit (INDEPENDENT_AMBULATORY_CARE_PROVIDER_SITE_OTHER): Payer: Self-pay | Admitting: Nurse Practitioner

## 2021-08-06 DIAGNOSIS — I83893 Varicose veins of bilateral lower extremities with other complications: Secondary | ICD-10-CM

## 2021-08-06 DIAGNOSIS — O0992 Supervision of high risk pregnancy, unspecified, second trimester: Secondary | ICD-10-CM | POA: Insufficient documentation

## 2021-08-09 ENCOUNTER — Encounter (INDEPENDENT_AMBULATORY_CARE_PROVIDER_SITE_OTHER): Payer: Self-pay

## 2021-08-31 ENCOUNTER — Encounter (INDEPENDENT_AMBULATORY_CARE_PROVIDER_SITE_OTHER): Payer: Self-pay

## 2021-09-06 ENCOUNTER — Other Ambulatory Visit: Payer: Self-pay | Admitting: Obstetrics and Gynecology

## 2021-09-21 ENCOUNTER — Ambulatory Visit: Payer: Self-pay

## 2021-09-21 ENCOUNTER — Other Ambulatory Visit: Payer: Self-pay

## 2021-09-21 ENCOUNTER — Ambulatory Visit: Payer: Medicaid Other | Attending: Obstetrics and Gynecology

## 2021-09-21 DIAGNOSIS — Z36 Encounter for antenatal screening for chromosomal anomalies: Secondary | ICD-10-CM | POA: Diagnosis not present

## 2021-09-21 NOTE — Progress Notes (Signed)
?[facility] ?Length of Consultation: 50 minutes ? ? ?Ms. Courtney Swanson  was referred to [facility] for genetic counseling to review prenatal screening and testing options.  This note summarizes the information we discussed.  The patient was present at the visit alone. ? ?We offered the following routine screening tests for this pregnancy: ? ?Cell free fetal DNA testing from maternal blood may be used to determine whether a baby is at high risk for Down syndrome, trisomy 1613, or trisomy 4018.  This test utilizes a maternal blood sample and DNA sequencing technology to isolate circulating cell free fetal DNA from maternal plasma.  The fetal DNA can then be analyzed for DNA sequences that are derived from the three most common chromosomes involved in aneuploidy, chromosomes 13, 18, and 21.  If the overall amount of DNA is greater than the expected level for any of these chromosomes, aneuploidy is suspected.  The detection rate for Down syndrome and trisomy 18 is >99% and the detection rate for trisomy 13 is >91%. While we do not consider it a replacement for invasive testing and karyotype analysis, a negative result from this testing would be reassuring, though not a guarantee of a normal chromosome complement for the baby.  An abnormal result is certainly suggestive of an abnormal chromosome complement, though we would still recommend CVS or amniocentesis to confirm any findings from this testing. This testing can also assess for the sex chromosomes and can detect approximately 96% of sex chromosome aneuploidies and determine fetal gender with >99% confidence.  Because this is a twin gestation, we would recommend testing through [place] with the Panorama platform, as this can determine zygosity due to the use of SNP based testing which can differentiate between the twins. ? ?Maternal serum marker screening, a blood test that measures pregnancy proteins, can provide risk assessments for  Down syndrome, trisomy 18, and open neural tube defects (spina bifida, anencephaly). Because it does not directly examine the fetus, it cannot positively diagnose or rule out these problems. AFP only testing can be considered in the second trimester to assess for open neural tube defects. ? ?Targeted ultrasound uses high frequency sound waves to create an image of the developing fetus.  An ultrasound is often recommended as a routine means of evaluating the pregnancy.  It is also used to screen for fetal anatomy problems (for example, a heart defect) that might be suggestive of a chromosomal or other abnormality.  ? ?Should these screening tests indicate an increased concern, then the following additional testing options would be offered: ? ?The chorionic villus sampling procedure is available for first trimester chromosome analysis.  This involves the withdrawal of a small amount of chorionic villi (tissue from the developing placenta).  Risk of pregnancy loss is estimated to be approximately 1 in 200 to 1 in 100 (0.5 to 1%).  There is approximately a 1% (1 in 100) chance that the CVS chromosome results will be unclear.  Chorionic villi cannot be tested for neural tube defects.    ? ?Amniocentesis involves the removal of a small amount of amniotic fluid from the sac surrounding the fetus with the use of a thin needle inserted through the maternal abdomen and uterus.  Ultrasound guidance is used throughout the procedure.  Fetal cells from amniotic fluid are directly evaluated and > 99.5% of chromosome problems and > 98% of open neural tube defects can be detected. This procedure is generally performed after the 15th week  of pregnancy.  The main risks to this procedure include complications leading to miscarriage in less than 1 in 200 cases (0.5%). ? ?Cystic Fibrosis and Spinal Muscular Atrophy (SMA) screening were also discussed with the patient. Both conditions are recessive, which means that both parents must be  carriers in order to have a child with the disease.  Cystic fibrosis (CF) is one of the most common genetic conditions in persons of Caucasian ancestry.  This condition occurs in approximately 1 in 2,500 Caucasian persons and results in thickened secretions in the lungs, digestive, and reproductive systems.  For a baby to be at risk for having CF, both of the parents must be carriers for this condition.  Approximately 1 in 4 Caucasian persons is a carrier for CF.  Current carrier testing looks for the most common mutations in the gene for CF and can detect approximately 90% of carriers in the Caucasian population.  This means that the carrier screening can greatly reduce, but cannot eliminate, the chance for an individual to have a child with CF.  If an individual is found to be a carrier for CF, then carrier testing would be available for the partner. As part of [place]?s newborn screening profile, all babies born in the state of [place] will have a two-tier screening process.  Specimens are first tested to determine the concentration of immunoreactive trypsinogen (IRT).  The top 5% of specimens with the highest IRT values then undergo DNA testing using a panel of over 40 common CF mutations. SMA is a neurodegenerative disorder that leads to atrophy of skeletal muscle and overall weakness.  This condition is also more prevalent in the Caucasian population, with 1 in 40-1 in 60 persons being a carrier and 1 in 6,000-1 in 10,000 children being affected.  There are multiple forms of the disease, with some causing death in infancy to other forms with survival into adulthood.  The genetics of SMA is complex, but carrier screening can detect up to 95% of carriers in the Caucasian population.  Similar to CF, a negative result can greatly reduce, but cannot eliminate, the chance to have a child with SMA. Hemoglobinopathy screening was also made available. ? ?We obtained a detailed family history and  pregnancy history.  This is the second pregnancy for this patient.  She has a healthy 67 year old son from a prior relationship. The father of the baby, Courtney Swanson, has an 58 year old daughter. She is said to have a heart condition which developed at around 27 years old.  He stated that it was not a structural heart defect, but Ms. Wish planned to try and get more details and let us know if she wanted to discuss this further.  Ms. Chambers mother had a baby who passed away (a half sibling to our patient) at birth due to birth defects including an absent spleen.  She was not sure of other details or the gestational age at the time of the birth, but did report that her mother was using numerous drugs frequently throughout that pregnancy and was told that was the cause for the birth defects. If that were the cause, then we would not expect this to recur in the family.  However, there are some genetic factors that cause asplenia syndrome which can also be associated with cardiac defects in some individuals.  We are happy to review records on this family member if they are available.  Lastly, Ms. Zadrozny reported a family history of  mental health conditions as well as ADHD in herself and other relatives.  We reviewed that these conditions are likely the result of a complex combination of genetic as well as environmental or situational factors. The remainder of the family history was reported to be unremarkable for birth defects, intellectual delays, recurrent pregnancy loss or known chromosome abnormalities.  ? ?Ms. Ballester has had no complications in this pregnancy.  She reported no exposure to alcohol, recreational drugs or prescription medications.  She was smoking a pack per day prior to the pregnancy, but stated that she has cut back to approximately 1 per day.  As we discussed, smoking during pregnancy has been associated with low birth weight, premature delivery and pregnancy loss.  For this reason, we suggest that she  continue to cut back or avoid smoking for the remainder of the pregnancy. ? ?After consideration of the options, Ms. Hedeen elected to proceed with Panorama testing today. Results should be available in one week.  She

## 2021-09-28 ENCOUNTER — Telehealth: Payer: Self-pay | Admitting: Genetics

## 2021-09-28 ENCOUNTER — Other Ambulatory Visit: Payer: Self-pay

## 2021-09-28 NOTE — Telephone Encounter (Signed)
Called [facility] to return NIPS results. Left voicemail with [facility] call back number. ?

## 2021-10-04 ENCOUNTER — Telehealth: Payer: Self-pay | Admitting: Genetics

## 2021-10-04 ENCOUNTER — Other Ambulatory Visit: Payer: Self-pay

## 2021-10-04 DIAGNOSIS — Z3689 Encounter for other specified antenatal screening: Secondary | ICD-10-CM

## 2021-10-04 NOTE — Telephone Encounter (Addendum)
Courtney Swanson called the center for maternal [facility]. We shared the sex of the pregnancy with him (fraternal, dizygotic twins with one female fetus and one female fetus).  ?

## 2021-10-04 NOTE — Telephone Encounter (Signed)
Deaven returned genetic counseling's phone call. We reviewed that her NIPS result is low risk. This screening significantly reduces the risk that the current pregnancy has Down syndrome, Trisomy 49, and Trisomy 17. Jalaysia requested that genetic counseling call Harriett Sine (husband) with the information regarding the sex of the pregnancy at [phone]. Genetic counseling called Harriett Sine but he was not available. Hadley did not want genetic counseling to leave him a voicemail. Genetic counseling will attempt to call him again this afternoon.  ?

## 2021-11-18 ENCOUNTER — Other Ambulatory Visit: Payer: Self-pay

## 2021-11-18 DIAGNOSIS — O30042 Twin pregnancy, dichorionic/diamniotic, second trimester: Secondary | ICD-10-CM

## 2021-11-18 DIAGNOSIS — O99322 Drug use complicating pregnancy, second trimester: Secondary | ICD-10-CM

## 2021-11-25 ENCOUNTER — Ambulatory Visit: Payer: Medicaid Other | Attending: Obstetrics

## 2021-11-25 ENCOUNTER — Other Ambulatory Visit: Payer: Self-pay

## 2021-11-25 ENCOUNTER — Ambulatory Visit (HOSPITAL_BASED_OUTPATIENT_CLINIC_OR_DEPARTMENT_OTHER): Payer: Medicaid Other | Admitting: Obstetrics

## 2021-11-25 DIAGNOSIS — Z3689 Encounter for other specified antenatal screening: Secondary | ICD-10-CM

## 2021-11-25 DIAGNOSIS — F1120 Opioid dependence, uncomplicated: Secondary | ICD-10-CM | POA: Diagnosis not present

## 2021-11-25 DIAGNOSIS — Z3A21 21 weeks gestation of pregnancy: Secondary | ICD-10-CM

## 2021-11-25 DIAGNOSIS — O99322 Drug use complicating pregnancy, second trimester: Secondary | ICD-10-CM | POA: Insufficient documentation

## 2021-11-25 DIAGNOSIS — O30042 Twin pregnancy, dichorionic/diamniotic, second trimester: Secondary | ICD-10-CM

## 2021-11-25 DIAGNOSIS — Z363 Encounter for antenatal screening for malformations: Secondary | ICD-10-CM | POA: Insufficient documentation

## 2021-11-25 NOTE — Progress Notes (Signed)
MFM Note  Courtney Swanson was seen due to a spontaneously conceived twin pregnancy.  She denies any significant past medical history and denies any problems in her current pregnancy.     She is being treated with Suboxone replacement therapy.  The patient had a cell free DNA test earlier in her pregnancy which indicated a low risk for trisomy 31, 18, and 13.  These are predicted to be dizygotic/fraternal twins.  A female and female fetus is predicted.     A thick dividing membrane was noted separating the two fetuses, indicating that these are dichorionic, diamniotic twins.  The fetal growth and amniotic fluid level appeared appropriate for both twin A and twin B.    There were no obvious anomalies noted in either twin A or twin B today.  However, the views of the fetal anatomy were limited today due to the fetal positions.  The limitations of ultrasound in the detection of all anomalies was discussed today.  The management of dichorionic twins was discussed.  She was advised that management of twin pregnancies will involve frequent ultrasound exams to assess the fetal growth and amniotic fluid level.   We will continue to follow her with serial growth ultrasounds.    Weekly fetal testing for dichorionic twins should start at around 36 weeks.    Delivery for uncomplicated dichorionic twins should occur at around 38 weeks.  The increased risk of preeclampsia, gestational diabetes, and preterm birth/labor associated with twin pregnancies was discussed.  She was advised that she will continue to be followed closely to assess for these conditions.   As pregnancies with multiple gestations are at increased risk for developing preeclampsia, she was advised to continue taking up to 2 tablets of baby aspirin daily (81 mg) to decrease her risk of developing preeclampsia.  The implications and management of Suboxone treatment in pregnancy was discussed in detail today.  She was advised that her baby  may require a prolonged hospitalization to be weaned off opioids after delivery.  A follow-up exam was scheduled in 4 weeks to assess the fetal growth and to complete the views of the fetal anatomy.  A total of 30 minutes was spent counseling and coordinating the care for this patient.  Greater than 50% of the time was spent in direct face-to-face contact.

## 2021-12-16 ENCOUNTER — Encounter (INDEPENDENT_AMBULATORY_CARE_PROVIDER_SITE_OTHER): Payer: Medicaid Other | Admitting: Nurse Practitioner

## 2021-12-16 ENCOUNTER — Encounter (INDEPENDENT_AMBULATORY_CARE_PROVIDER_SITE_OTHER): Payer: Medicaid Other

## 2021-12-21 ENCOUNTER — Other Ambulatory Visit: Payer: Self-pay

## 2021-12-21 DIAGNOSIS — F1120 Opioid dependence, uncomplicated: Secondary | ICD-10-CM

## 2021-12-21 DIAGNOSIS — O30043 Twin pregnancy, dichorionic/diamniotic, third trimester: Secondary | ICD-10-CM

## 2021-12-21 DIAGNOSIS — O30042 Twin pregnancy, dichorionic/diamniotic, second trimester: Secondary | ICD-10-CM

## 2021-12-23 ENCOUNTER — Ambulatory Visit: Payer: Medicaid Other | Attending: Obstetrics

## 2021-12-23 ENCOUNTER — Other Ambulatory Visit: Payer: Self-pay

## 2021-12-23 DIAGNOSIS — F1120 Opioid dependence, uncomplicated: Secondary | ICD-10-CM | POA: Insufficient documentation

## 2021-12-23 DIAGNOSIS — Z79891 Long term (current) use of opiate analgesic: Secondary | ICD-10-CM | POA: Diagnosis not present

## 2021-12-23 DIAGNOSIS — Z3A00 Weeks of gestation of pregnancy not specified: Secondary | ICD-10-CM | POA: Insufficient documentation

## 2021-12-23 DIAGNOSIS — O30043 Twin pregnancy, dichorionic/diamniotic, third trimester: Secondary | ICD-10-CM | POA: Diagnosis present

## 2021-12-23 DIAGNOSIS — Z3A25 25 weeks gestation of pregnancy: Secondary | ICD-10-CM | POA: Diagnosis not present

## 2021-12-23 DIAGNOSIS — O99891 Other specified diseases and conditions complicating pregnancy: Secondary | ICD-10-CM

## 2021-12-23 DIAGNOSIS — O99323 Drug use complicating pregnancy, third trimester: Secondary | ICD-10-CM | POA: Diagnosis not present

## 2021-12-28 ENCOUNTER — Other Ambulatory Visit: Payer: Self-pay

## 2021-12-28 ENCOUNTER — Encounter: Payer: Self-pay | Admitting: Obstetrics and Gynecology

## 2021-12-28 ENCOUNTER — Inpatient Hospital Stay: Payer: Medicaid Other

## 2021-12-28 ENCOUNTER — Observation Stay
Admission: EM | Admit: 2021-12-28 | Discharge: 2021-12-28 | Disposition: A | Discharge status: Home / Self Care | Payer: Medicaid Other | Attending: Obstetrics | Admitting: Obstetrics

## 2021-12-28 DIAGNOSIS — O30042 Twin pregnancy, dichorionic/diamniotic, second trimester: Secondary | ICD-10-CM | POA: Diagnosis not present

## 2021-12-28 DIAGNOSIS — N898 Other specified noninflammatory disorders of vagina: Principal | ICD-10-CM | POA: Diagnosis present

## 2021-12-28 DIAGNOSIS — Z3A26 26 weeks gestation of pregnancy: Secondary | ICD-10-CM | POA: Insufficient documentation

## 2021-12-28 DIAGNOSIS — Z79899 Other long term (current) drug therapy: Secondary | ICD-10-CM | POA: Insufficient documentation

## 2021-12-28 DIAGNOSIS — O4192X0 Disorder of amniotic fluid and membranes, unspecified, second trimester, not applicable or unspecified: Secondary | ICD-10-CM | POA: Diagnosis present

## 2021-12-28 DIAGNOSIS — J45909 Unspecified asthma, uncomplicated: Secondary | ICD-10-CM | POA: Insufficient documentation

## 2021-12-28 DIAGNOSIS — O4290 Premature rupture of membranes, unspecified as to length of time between rupture and onset of labor, unspecified weeks of gestation: Secondary | ICD-10-CM

## 2021-12-28 DIAGNOSIS — F17210 Nicotine dependence, cigarettes, uncomplicated: Secondary | ICD-10-CM | POA: Diagnosis not present

## 2021-12-28 DIAGNOSIS — O99332 Smoking (tobacco) complicating pregnancy, second trimester: Secondary | ICD-10-CM | POA: Diagnosis not present

## 2021-12-28 DIAGNOSIS — O30002 Twin pregnancy, unspecified number of placenta and unspecified number of amniotic sacs, second trimester: Secondary | ICD-10-CM

## 2021-12-28 DIAGNOSIS — O99512 Diseases of the respiratory system complicating pregnancy, second trimester: Secondary | ICD-10-CM | POA: Insufficient documentation

## 2021-12-28 LAB — FETAL FIBRONECTIN: Fetal Fibronectin: POSITIVE — AB

## 2021-12-28 LAB — WET PREP, GENITAL
Clue Cells Wet Prep HPF POC: NONE SEEN
Sperm: NONE SEEN
Trich, Wet Prep: NONE SEEN
WBC, Wet Prep HPF POC: >=10 — AB (ref <10)
Yeast Wet Prep HPF POC: NONE SEEN

## 2021-12-28 MED ORDER — BETAMETHASONE SOD PHOS & ACET 6 (3-3) MG/ML IJ SUSP: 12.0000 mg | INTRAMUSCULAR | Status: DC

## 2021-12-28 MED ORDER — CALCIUM CARBONATE ANTACID 500 MG PO CHEW: 2.0000 | CHEWABLE_TABLET | ORAL | Status: DC | PRN

## 2021-12-28 MED ORDER — DOCUSATE SODIUM 100 MG PO CAPS: 100.0000 mg | ORAL_CAPSULE | Freq: Every day | ORAL | Status: DC

## 2021-12-28 MED ORDER — TERBUTALINE SULFATE 1 MG/ML IJ SOLN: 0.2500 mg | Freq: Once | INTRAMUSCULAR | Status: DC | PRN

## 2021-12-28 MED ORDER — PRENATAL MULTIVITAMIN CH: 1.0000 | ORAL_TABLET | Freq: Every day | ORAL | Status: DC

## 2021-12-28 MED ORDER — ZOLPIDEM TARTRATE 5 MG PO TABS: 5.0000 mg | ORAL_TABLET | Freq: Every evening | ORAL | Status: DC | PRN

## 2021-12-28 NOTE — OB Triage Note (Signed)
Patient is a G2P1 at [redacted]w[redacted]d who presents to unit c/o loss of mucous plug earlier when she woke up. Patient describes it is yellow snot "like the one that comes out from your nose". Reports +FM from both twins and occasional Deberah Pelton. Denies LOF and vaginal bleeding. Reports sexual intercourse in the last 48 hours. External monitors applied and assessing. Vital signs WDL.

## 2021-12-28 NOTE — Progress Notes (Deleted)
Courtney Swanson is a 26 y.o. female. She is at [redacted]w[redacted]d gestation. Patient's last menstrual period was 06/28/2021. Estimated Date of Delivery: 04/04/22  Prenatal care site: [facility]  Chief complaint: loss of mucous plug  HPI: Courtney Swanson presents to L&D with complaints of loss of mucus plug, yellow snot like discharge. She denies VB, LOF, endorses sexual intercourse 2 days ago.  Factors complicating pregnancy: Di/Di twins Hx of substance abuse Hx of alcohol abuse Hx of anemia Rubella immune Varicella immune Tobacco user Hx of anxiety Vitamin D deficiency On Suboxone 8 mg TID S: Resting comfortably. no CTX, no VB.no LOF,  Active fetal movement.   Maternal Medical History:  Past Medical Hx:  has a past medical history of Asthma and Substance use disorder.    Past Surgical Hx:  has a past surgical history that includes No past surgeries.   Allergies  Allergen Reactions   Amoxicillin Other (See Comments)    Mother told me I was allergic   Tylenol [Acetaminophen] Nausea And Vomiting     Prior to Admission medications   Medication Sig Start Date End Date Taking? Authorizing Provider  albuterol (PROVENTIL HFA;VENTOLIN HFA) 108 (90 Base) MCG/ACT inhaler Inhale 2 puffs into the lungs every 6 (six) hours as needed for wheezing or shortness of breath. 09/08/15   Beers, Charmayne Sheer, PA-C  buprenorphine-naloxone (SUBOXONE) 8-2 mg SUBL SL tablet Place 1 tablet under the tongue 3 (three) times daily. 05/26/21   [provider]  EPINEPHrine 0.3 mg/0.3 mL IJ SOAJ injection Inject 0.3 mg into the muscle as needed for anaphylaxis. 11/24/20   Nita Sickle, MD  famotidine (PEPCID) 20 MG tablet Take 20 mg by mouth 2 (two) times daily.    [provider]  fluticasone (FLONASE) 50 MCG/ACT nasal spray Place 1 spray into both nostrils daily. 09/08/15 09/07/16  Beers, Charmayne Sheer, PA-C  ondansetron (ZOFRAN ODT) 4 MG disintegrating tablet Allow 1-2 tablets to dissolve in your  mouth every 8 hours as needed for nausea/vomiting 04/16/18   Loleta Rose, MD  promethazine (PHENERGAN) 25 MG tablet Take 25 mg by mouth every 6 (six) hours as needed for nausea or vomiting.    [provider]    Social History: She  reports that she has been smoking cigarettes. She has a 2.00 pack-year smoking history. She has never used smokeless tobacco. She reports that she does not currently use alcohol after a past usage of about 3.0 standard drinks of alcohol per week. She reports that she does not currently use drugs after having used the following drugs: Hydrocodone.  Family History: family history includes Bipolar disorder in her mother; Depression in her mother; Diabetes in her mother; Hypertension in her mother; Post-traumatic stress disorder in her mother. ,no history of gyn cancers   Review of Systems: A full review of systems was performed and negative except as noted in the HPI.     Pertinent Results:  Prenatal Labs: Blood type/Rh *O pos  Antibody screen Negative    Rubella Non immune    Varicella Not immune  RPR     HBsAg    Hep C NR   HIV     GC neg  Chlamydia neg  Genetic screening cfDNA negative   1 hour GTT   3 hour GTT N/A  GBS       O:  BP (!) 110/54 (BP Location: Left Arm)   Pulse 78   Temp 98.4 F (36.9 C) (Oral)   Resp 15  Ht 5\' 7"  (1.702 m)   Wt 83.9 kg   LMP 06/28/2021   BMI 28.98 kg/m  Results for orders placed or performed during the hospital encounter of 12/28/21 (from the past 48 hour(s))  Wet prep, genital   Collection Time: 12/28/21  6:35 PM  Result Value Ref Range   Yeast Wet Prep HPF POC NONE SEEN NONE SEEN   Trich, Wet Prep NONE SEEN NONE SEEN   Clue Cells Wet Prep HPF POC NONE SEEN NONE SEEN   WBC, Wet Prep HPF POC >=10 (A) <10   Sperm NONE SEEN   Fetal fibronectin   Collection Time: 12/28/21  6:35 PM  Result Value Ref Range   Fetal Fibronectin POSITIVE (A) NEGATIVE     Constitutional: NAD, AAOx3  HE/ENT:  extraocular movements grossly intact, moist mucous membranes CV: RRR PULM: nl respiratory effort Abd: gravid, non-tender, non-distended, soft  Ext: Non-tender, Nonedmeatous Psych: mood appropriate, speech normal Pelvic : deferred SVE: Dilation: 1.5 Effacement (%): Thick Station: -3 Exam by:: [email or web address], CNM   Fetal Monitor:Baby A Baseline: 145 bpm Variability: moderate Accels: Present 10x10 Decels: variable Toco: none  Fetal Monitor: baby B Baseline: 135 bpm Variability: moderate Accels: Present 10x10 Decels: none Toco: none  Category: I NST: reactive appropriate for gestational age   Assessment: 26 y.o. [redacted]w[redacted]d here for antenatal surveillance during pregnancy.  Principle diagnosis:   Plan: observe patient overnight d/t positive FFN and cervical dilation. Patient states she is fine and going home to her other child.She states she was not mentally prepared to stay and denies feeling any contractions. Discussed with patient the risks of signing out of the hospital AMA. Preterm labor precautions  reviewed. Wet Prep- neg GC/Chlamydia- neg Positive FFN Limited OB [redacted]w[redacted]d reassuring  NST reviewed - reactive tracing  Fetal Wellbeing: Reassuring Cat 1 tracing. Patient signed AMA form  Dr [place] aware  ----- Feliberto Gottron CNM Certified Nurse Midwife [place]  [facility]

## 2021-12-28 NOTE — Progress Notes (Addendum)
Pt requesting to leave AMA at this time. CNM Dickerson and this RN at bedside at this time discussing with pt the risks of leaving against medical advice including fetal demises. Pt verbally states that should she have leaking of fluid, vaginal bleeding, or contractions she will go to [facility] (the closest hospital to her home) and be evaluated. Pt reported pelvic pressure to this RN on her way down to ED at which time this RN asked pt if she would like to stay. Pt stated "no, I want to go home." Pt escorted by wheelchair down to Emergency Department parking lot where she parked. Pt ambulatory into parking lot with steady gait and NAD noted.

## 2021-12-28 NOTE — Progress Notes (Signed)
Courtney Swanson is a 26 y.o. female. She is at [redacted]w[redacted]d gestation. Patient's last menstrual period was 06/28/2021. Estimated Date of Delivery: 04/04/22  [facility] care site: [facility]  Chief complaint: loss of mucous plug  HPI: Courtney Swanson presents to L&D with complaints of loss of mucus plug, yellow snot like discharge. She denies VB, LOF, endorses sexual intercourse 2 days ago.  Factors complicating pregnancy: Di/Di twins Hx of substance abuse Hx of alcohol abuse Hx of anemia Rubella immune Varicella immune Tobacco user Hx of anxiety Vitamin D deficiency On Suboxone 8 mg TID S: Resting comfortably. no CTX, no VB.no LOF,  Active fetal movement.   Maternal Medical History:  Past Medical Hx:  has a past medical history of Asthma and Substance use disorder.    Past Surgical Hx:  has a past surgical history that includes No past surgeries.   Allergies  Allergen Reactions   Amoxicillin Other (See Comments)    Mother told me I was allergic   Tylenol [Acetaminophen] Nausea And Vomiting     Prior to Admission medications   Medication Sig Start Date End Date Taking? Authorizing Provider  albuterol (PROVENTIL HFA;VENTOLIN HFA) 108 (90 Base) MCG/ACT inhaler Inhale 2 puffs into the lungs every 6 (six) hours as needed for wheezing or shortness of breath. 09/08/15   Courtney Swanson, Courtney Sheer, PA-C  buprenorphine-naloxone (SUBOXONE) 8-2 mg SUBL SL tablet Place 1 tablet under the tongue 3 (three) times daily. 05/26/21   [provider]  EPINEPHrine 0.3 mg/0.3 mL IJ SOAJ injection Inject 0.3 mg into the muscle as needed for anaphylaxis. 11/24/20   Courtney Sickle, MD  famotidine (PEPCID) 20 MG tablet Take 20 mg by mouth 2 (two) times daily.    [provider]  fluticasone (FLONASE) 50 MCG/ACT nasal spray Place 1 spray into both nostrils daily. 09/08/15 09/07/16  Courtney Swanson, Courtney Sheer, PA-C  ondansetron (ZOFRAN ODT) 4 MG disintegrating tablet Allow 1-2 tablets to dissolve in your  mouth every 8 hours as needed for nausea/vomiting 04/16/18   Courtney Rose, MD  promethazine (PHENERGAN) 25 MG tablet Take 25 mg by mouth every 6 (six) hours as needed for nausea or vomiting.    [provider]    Social History: She  reports that she has been smoking cigarettes. She has a 2.00 pack-year smoking history. She has never used smokeless tobacco. She reports that she does not currently use alcohol after a past usage of about 3.0 standard drinks of alcohol per week. She reports that she does not currently use drugs after having used the following drugs: Hydrocodone.  Family History: family history includes Bipolar disorder in her mother; Depression in her mother; Diabetes in her mother; Hypertension in her mother; Post-traumatic stress disorder in her mother. ,no history of gyn cancers   Review of Systems: A full review of systems was performed and negative except as noted in the HPI.     Pertinent Results:  Prenatal Labs: Blood type/Rh *O pos  Antibody screen Negative    Rubella Non immune    Varicella Not immune  RPR     HBsAg    Hep C NR   HIV     GC neg  Chlamydia neg  Genetic screening cfDNA negative   1 hour GTT   3 hour GTT N/A  GBS       O:  BP (!) 110/54 (BP Location: Left Arm)   Pulse 78   Temp 98.4 F (36.9 C) (Oral)   Resp 15  Ht 5\' 7"  (1.702 m)   Wt 83.9 kg   LMP 06/28/2021   BMI 28.98 kg/m  Results for orders placed or performed during the hospital encounter of 12/28/21 (from the past 48 hour(s))  Wet prep, genital   Collection Time: 12/28/21  6:35 PM  Result Value Ref Range   Yeast Wet Prep HPF POC NONE SEEN NONE SEEN   Trich, Wet Prep NONE SEEN NONE SEEN   Clue Cells Wet Prep HPF POC NONE SEEN NONE SEEN   WBC, Wet Prep HPF POC >=10 (A) <10   Sperm NONE SEEN   Fetal fibronectin   Collection Time: 12/28/21  6:35 PM  Result Value Ref Range   Fetal Fibronectin POSITIVE (A) NEGATIVE     Constitutional: NAD, AAOx3  HE/ENT:  extraocular movements grossly intact, moist mucous membranes CV: RRR PULM: nl respiratory effort Abd: gravid, non-tender, non-distended, soft  Ext: Non-tender, Nonedmeatous Psych: mood appropriate, speech normal Pelvic : deferred SVE:     Fetal Monitor:Baby A Baseline: 145 bpm Variability: moderate Accels: Present 10x10 Decels: variable Toco: none  Fetal Monitor: baby B Baseline: 135 bpm Variability: moderate Accels: Present 10x10 Decels: none Toco: none  Category: I NST: reactive appropriate for gestational age   Assessment: 26 y.o. [redacted]w[redacted]d here for antenatal surveillance during pregnancy.  Principle diagnosis:   Plan: Labor: not present.  NST reviewed - reactive tracing  Fetal Wellbeing: Reassuring Cat 1 tracing. D/c home stable, precautions reviewed, follow-up as scheduled.   ----- [redacted]w[redacted]d CNM Certified Nurse Midwife [place]  [facility]

## 2021-12-28 NOTE — Discharge Summary (Signed)
Courtney Swanson is a 26 y.o. female. She is at [redacted]w[redacted]d gestation. Patient's last menstrual period was 06/28/2021. Estimated Date of Delivery: 04/04/22  Prenatal care site: [facility]  Chief complaint: loss of mucous plug  HPI: Courtney Swanson presents to L&D with complaints of loss of mucus plug, yellow snot like discharge. She denies VB, LOF, endorses sexual intercourse 2 days ago.  Factors complicating pregnancy: Di/Di twins Hx of substance abuse Hx of alcohol abuse Hx of anemia Rubella immune Varicella immune Tobacco user Hx of anxiety Vitamin D deficiency On Suboxone 8 mg TID S: Resting comfortably. no CTX, no VB.no LOF,  Active fetal movement.   Maternal Medical History:  Past Medical Hx:  has a past medical history of Asthma and Substance use disorder.    Past Surgical Hx:  has a past surgical history that includes No past surgeries.   Allergies  Allergen Reactions   Amoxicillin Other (See Comments)    Mother told me I was allergic   Tylenol [Acetaminophen] Nausea And Vomiting     Prior to Admission medications   Medication Sig Start Date End Date Taking? Authorizing Provider  albuterol (PROVENTIL HFA;VENTOLIN HFA) 108 (90 Base) MCG/ACT inhaler Inhale 2 puffs into the lungs every 6 (six) hours as needed for wheezing or shortness of breath. 09/08/15   Beers, Courtney Sheer, PA-C  buprenorphine-naloxone (SUBOXONE) 8-2 mg SUBL SL tablet Place 1 tablet under the tongue 3 (three) times daily. 05/26/21   [provider]  EPINEPHrine 0.3 mg/0.3 mL IJ SOAJ injection Inject 0.3 mg into the muscle as needed for anaphylaxis. 11/24/20   Courtney Sickle, MD  famotidine (PEPCID) 20 MG tablet Take 20 mg by mouth 2 (two) times daily.    [provider]  fluticasone (FLONASE) 50 MCG/ACT nasal spray Place 1 spray into both nostrils daily. 09/08/15 09/07/16  Beers, Courtney Sheer, PA-C  ondansetron (ZOFRAN ODT) 4 MG disintegrating tablet Allow 1-2 tablets to dissolve in your  mouth every 8 hours as needed for nausea/vomiting 04/16/18   Courtney Rose, MD  promethazine (PHENERGAN) 25 MG tablet Take 25 mg by mouth every 6 (six) hours as needed for nausea or vomiting.    [provider]    Social History: She  reports that she has been smoking cigarettes. She has a 2.00 pack-year smoking history. She has never used smokeless tobacco. She reports that she does not currently use alcohol after a past usage of about 3.0 standard drinks of alcohol per week. She reports that she does not currently use drugs after having used the following drugs: Hydrocodone.  Family History: family history includes Bipolar disorder in her mother; Depression in her mother; Diabetes in her mother; Hypertension in her mother; Post-traumatic stress disorder in her mother. ,no history of gyn cancers   Review of Systems: A full review of systems was performed and negative except as noted in the HPI.     Pertinent Results:  Prenatal Labs: Blood type/Rh *O pos  Antibody screen Negative    Rubella Non immune    Varicella Not immune  RPR     HBsAg    Hep C NR   HIV     GC neg  Chlamydia neg  Genetic screening cfDNA negative   1 hour GTT   3 hour GTT N/A  GBS       O:  BP (!) 110/54 (BP Location: Left Arm)   Pulse 78   Temp 98.4 F (36.9 C) (Oral)   Resp 15  Ht 5' 7" (1.702 m)   Wt 83.9 kg   LMP 06/28/2021   BMI 28.98 kg/m  Results for orders placed or performed during the hospital encounter of 12/28/21 (from the past 48 hour(s))  Wet prep, genital   Collection Time: 12/28/21  6:35 PM  Result Value Ref Range   Yeast Wet Prep HPF POC NONE SEEN NONE SEEN   Trich, Wet Prep NONE SEEN NONE SEEN   Clue Cells Wet Prep HPF POC NONE SEEN NONE SEEN   WBC, Wet Prep HPF POC >=10 (A) <10   Sperm NONE SEEN   Fetal fibronectin   Collection Time: 12/28/21  6:35 PM  Result Value Ref Range   Fetal Fibronectin POSITIVE (A) NEGATIVE     Constitutional: NAD, AAOx3  HE/ENT:  extraocular movements grossly intact, moist mucous membranes CV: RRR PULM: nl respiratory effort Abd: gravid, non-tender, non-distended, soft  Ext: Non-tender, Nonedmeatous Psych: mood appropriate, speech normal Pelvic : deferred SVE: Dilation: 1.5 Effacement (%): Thick Station: -3 Exam by:: Courtney Swanson, Courtney Swanson   Fetal Monitor:Baby A Baseline: 145 bpm Variability: moderate Accels: Present 10x10 Decels: variable Toco: none  Fetal Monitor: baby B Baseline: 135 bpm Variability: moderate Accels: Present 10x10 Decels: none Toco: none  Category: I NST: reactive appropriate for gestational age   Assessment: 26 y.o. [redacted]w[redacted]d here for antenatal surveillance during pregnancy.  Principle diagnosis:   Plan: observe patient overnight d/t positive FFN and cervical dilation. Patient states she is fine and going home to her other child.She states she was not mentally prepared to stay and denies feeling any contractions. Discussed with patient the risks of signing out of the hospital AMA. Preterm labor precautions  reviewed. Wet Prep- neg GC/Chlamydia- neg Positive FFN Limited OB US reassuring  NST reviewed - reactive tracing  Fetal Wellbeing: Reassuring Cat 1 tracing. Patient signed AMA form  Dr Courtney Swanson aware  ----- Courtney Swanson Courtney Swanson Certified Nurse Midwife [facility]    

## 2021-12-29 ENCOUNTER — Other Ambulatory Visit: Payer: Self-pay | Admitting: Obstetrics

## 2021-12-29 DIAGNOSIS — O30002 Twin pregnancy, unspecified number of placenta and unspecified number of amniotic sacs, second trimester: Secondary | ICD-10-CM

## 2021-12-29 DIAGNOSIS — O4290 Premature rupture of membranes, unspecified as to length of time between rupture and onset of labor, unspecified weeks of gestation: Secondary | ICD-10-CM

## 2022-01-05 ENCOUNTER — Encounter (INDEPENDENT_AMBULATORY_CARE_PROVIDER_SITE_OTHER): Payer: Medicaid Other | Admitting: Nurse Practitioner

## 2022-01-05 ENCOUNTER — Encounter (INDEPENDENT_AMBULATORY_CARE_PROVIDER_SITE_OTHER): Payer: Medicaid Other

## 2022-01-18 ENCOUNTER — Other Ambulatory Visit: Payer: Self-pay

## 2022-01-18 DIAGNOSIS — O30043 Twin pregnancy, dichorionic/diamniotic, third trimester: Secondary | ICD-10-CM

## 2022-01-18 DIAGNOSIS — F1120 Opioid dependence, uncomplicated: Secondary | ICD-10-CM

## 2022-01-20 ENCOUNTER — Ambulatory Visit: Payer: Medicaid Other

## 2022-02-01 ENCOUNTER — Encounter (INDEPENDENT_AMBULATORY_CARE_PROVIDER_SITE_OTHER): Payer: Medicaid Other

## 2022-02-01 ENCOUNTER — Encounter (INDEPENDENT_AMBULATORY_CARE_PROVIDER_SITE_OTHER): Payer: Medicaid Other | Admitting: Vascular Surgery

## 2022-07-24 ENCOUNTER — Inpatient Hospital Stay
Admission: EM | Admit: 2022-07-24 | Discharge: 2022-07-29 | DRG: 439 | Disposition: A | Discharge status: Home / Self Care | Payer: Medicaid Other | Attending: Student | Admitting: Student

## 2022-07-24 ENCOUNTER — Other Ambulatory Visit: Payer: Self-pay

## 2022-07-24 ENCOUNTER — Encounter: Payer: Self-pay | Admitting: Emergency Medicine

## 2022-07-24 DIAGNOSIS — Z833 Family history of diabetes mellitus: Secondary | ICD-10-CM

## 2022-07-24 DIAGNOSIS — I10 Essential (primary) hypertension: Secondary | ICD-10-CM | POA: Diagnosis present

## 2022-07-24 DIAGNOSIS — Z720 Tobacco use: Secondary | ICD-10-CM | POA: Diagnosis not present

## 2022-07-24 DIAGNOSIS — F1911 Other psychoactive substance abuse, in remission: Secondary | ICD-10-CM | POA: Diagnosis present

## 2022-07-24 DIAGNOSIS — Z79899 Other long term (current) drug therapy: Secondary | ICD-10-CM

## 2022-07-24 DIAGNOSIS — F1010 Alcohol abuse, uncomplicated: Secondary | ICD-10-CM | POA: Diagnosis not present

## 2022-07-24 DIAGNOSIS — R112 Nausea with vomiting, unspecified: Secondary | ICD-10-CM

## 2022-07-24 DIAGNOSIS — K852 Alcohol induced acute pancreatitis without necrosis or infection: Secondary | ICD-10-CM | POA: Diagnosis present

## 2022-07-24 DIAGNOSIS — Z8249 Family history of ischemic heart disease and other diseases of the circulatory system: Secondary | ICD-10-CM

## 2022-07-24 DIAGNOSIS — Z880 Allergy status to penicillin: Secondary | ICD-10-CM

## 2022-07-24 DIAGNOSIS — D72825 Bandemia: Secondary | ICD-10-CM | POA: Diagnosis present

## 2022-07-24 DIAGNOSIS — E876 Hypokalemia: Secondary | ICD-10-CM | POA: Diagnosis not present

## 2022-07-24 DIAGNOSIS — F17210 Nicotine dependence, cigarettes, uncomplicated: Secondary | ICD-10-CM | POA: Diagnosis present

## 2022-07-24 DIAGNOSIS — F1120 Opioid dependence, uncomplicated: Secondary | ICD-10-CM | POA: Diagnosis present

## 2022-07-24 DIAGNOSIS — D649 Anemia, unspecified: Secondary | ICD-10-CM | POA: Diagnosis present

## 2022-07-24 DIAGNOSIS — F39 Unspecified mood [affective] disorder: Secondary | ICD-10-CM | POA: Diagnosis present

## 2022-07-24 DIAGNOSIS — Z818 Family history of other mental and behavioral disorders: Secondary | ICD-10-CM

## 2022-07-24 DIAGNOSIS — Y904 Blood alcohol level of 80-99 mg/100 ml: Secondary | ICD-10-CM | POA: Diagnosis present

## 2022-07-24 DIAGNOSIS — F17200 Nicotine dependence, unspecified, uncomplicated: Secondary | ICD-10-CM | POA: Diagnosis present

## 2022-07-24 LAB — CBC WITH DIFFERENTIAL/PLATELET
Abs Immature Granulocytes: 0.01 10*3/uL (ref 0.00–0.07)
Basophils Absolute: 0 10*3/uL (ref 0.0–0.1)
Basophils Relative: 0 %
Eosinophils Absolute: 0.1 10*3/uL (ref 0.0–0.5)
Eosinophils Relative: 1 %
HCT: 31.6 % — ABNORMAL LOW (ref 36.0–46.0)
Hemoglobin: 10.6 g/dL — ABNORMAL LOW (ref 12.0–15.0)
Immature Granulocytes: 0 %
Lymphocytes Relative: 60 %
Lymphs Abs: 4.1 10*3/uL — ABNORMAL HIGH (ref 0.7–4.0)
MCH: 33.4 pg (ref 26.0–34.0)
MCHC: 33.5 g/dL (ref 30.0–36.0)
MCV: 99.7 fL (ref 80.0–100.0)
Monocytes Absolute: 0.3 10*3/uL (ref 0.1–1.0)
Monocytes Relative: 4 %
Neutro Abs: 2.4 10*3/uL (ref 1.7–7.7)
Neutrophils Relative %: 35 %
Platelets: 175 10*3/uL (ref 150–400)
RBC: 3.17 MIL/uL — ABNORMAL LOW (ref 3.87–5.11)
RDW: 16.3 % — ABNORMAL HIGH (ref 11.5–15.5)
WBC: 6.8 10*3/uL (ref 4.0–10.5)
nRBC: 0 % (ref 0.0–0.2)

## 2022-07-24 LAB — URINALYSIS, ROUTINE W REFLEX MICROSCOPIC
Bacteria, UA: NONE SEEN
Bilirubin Urine: NEGATIVE
Glucose, UA: NEGATIVE mg/dL
Hgb urine dipstick: NEGATIVE
Ketones, ur: NEGATIVE mg/dL
Nitrite: NEGATIVE
Protein, ur: NEGATIVE mg/dL
Specific Gravity, Urine: 1.006 (ref 1.005–1.030)
pH: 6 (ref 5.0–8.0)

## 2022-07-24 LAB — HEPATIC FUNCTION PANEL
ALT: 9 U/L (ref 0–44)
AST: 27 U/L (ref 15–41)
Albumin: 3.1 g/dL — ABNORMAL LOW (ref 3.5–5.0)
Alkaline Phosphatase: 31 U/L — ABNORMAL LOW (ref 38–126)
Bilirubin, Direct: 0.3 mg/dL — ABNORMAL HIGH (ref 0.0–0.2)
Indirect Bilirubin: 0.7 mg/dL (ref 0.3–0.9)
Total Bilirubin: 1 mg/dL (ref 0.3–1.2)
Total Protein: 6.9 g/dL (ref 6.5–8.1)

## 2022-07-24 LAB — RETICULOCYTES
Immature Retic Fract: 12.2 % (ref 2.3–15.9)
RBC.: 3.41 MIL/uL — ABNORMAL LOW (ref 3.87–5.11)
Retic Count, Absolute: 58.3 10*3/uL (ref 19.0–186.0)
Retic Ct Pct: 1.7 % (ref 0.4–3.1)

## 2022-07-24 LAB — FERRITIN: Ferritin: 82 ng/mL (ref 11–307)

## 2022-07-24 LAB — CBC
HCT: 34.3 % — ABNORMAL LOW (ref 36.0–46.0)
Hemoglobin: 11.8 g/dL — ABNORMAL LOW (ref 12.0–15.0)
MCH: 34.5 pg — ABNORMAL HIGH (ref 26.0–34.0)
MCHC: 34.4 g/dL (ref 30.0–36.0)
MCV: 100.3 fL — ABNORMAL HIGH (ref 80.0–100.0)
Platelets: 172 10*3/uL (ref 150–400)
RBC: 3.42 MIL/uL — ABNORMAL LOW (ref 3.87–5.11)
RDW: 16.1 % — ABNORMAL HIGH (ref 11.5–15.5)
WBC: 14 10*3/uL — ABNORMAL HIGH (ref 4.0–10.5)
nRBC: 0 % (ref 0.0–0.2)

## 2022-07-24 LAB — IRON AND TIBC
Iron: 305 ug/dL — ABNORMAL HIGH (ref 28–170)
Saturation Ratios: 60 % — ABNORMAL HIGH (ref 10.4–31.8)
TIBC: 505 ug/dL — ABNORMAL HIGH (ref 250–450)
UIBC: 200 ug/dL

## 2022-07-24 LAB — BASIC METABOLIC PANEL
Anion gap: 12 (ref 5–15)
BUN: 5 mg/dL — ABNORMAL LOW (ref 6–20)
CO2: 21 mmol/L — ABNORMAL LOW (ref 22–32)
Calcium: 8.4 mg/dL — ABNORMAL LOW (ref 8.9–10.3)
Chloride: 106 mmol/L (ref 98–111)
Creatinine, Ser: 0.53 mg/dL (ref 0.44–1.00)
GFR, Estimated: >60 mL/min (ref >60)
Glucose, Bld: 142 mg/dL — ABNORMAL HIGH (ref 70–99)
Potassium: 3.6 mmol/L (ref 3.5–5.1)
Sodium: 139 mmol/L (ref 135–145)

## 2022-07-24 LAB — URINE DRUG SCREEN, QUALITATIVE (ARMC ONLY)
Amphetamines, Ur Screen: NOT DETECTED
Barbiturates, Ur Screen: NOT DETECTED
Benzodiazepine, Ur Scrn: NOT DETECTED
Cannabinoid 50 Ng, Ur ~~LOC~~: NOT DETECTED
Cocaine Metabolite,Ur ~~LOC~~: NOT DETECTED
MDMA (Ecstasy)Ur Screen: NOT DETECTED
Methadone Scn, Ur: NOT DETECTED
Opiate, Ur Screen: NOT DETECTED
Phencyclidine (PCP) Ur S: NOT DETECTED
Tricyclic, Ur Screen: NOT DETECTED

## 2022-07-24 LAB — ETHANOL: Alcohol, Ethyl (B): 90 mg/dL — ABNORMAL HIGH (ref <10)

## 2022-07-24 LAB — LIPASE, BLOOD: Lipase: 315 U/L — ABNORMAL HIGH (ref 11–51)

## 2022-07-24 LAB — FOLATE: Folate: 5.3 ng/mL — ABNORMAL LOW (ref >5.9)

## 2022-07-24 MED ORDER — ONDANSETRON HCL 4 MG/2ML IJ SOLN
4.0000 mg | Freq: Four times a day (QID) | INTRAMUSCULAR | Status: DC | PRN
  Administered 2022-07-24 – 2022-07-26 (×5): 4 mg via INTRAVENOUS
  Filled 2022-07-24 (×5): qty 2

## 2022-07-24 MED ORDER — SERTRALINE HCL 50 MG PO TABS
50.0000 mg | ORAL_TABLET | Freq: Every day | ORAL | Status: DC
  Administered 2022-07-24 – 2022-07-29 (×6): 50 mg via ORAL
  Filled 2022-07-24 (×6): qty 1

## 2022-07-24 MED ORDER — SODIUM CHLORIDE 0.9 % IV SOLN
12.5000 mg | Freq: Four times a day (QID) | INTRAVENOUS | Status: DC | PRN
  Administered 2022-07-25: 12.5 mg via INTRAVENOUS
  Filled 2022-07-24: qty 12.5

## 2022-07-24 MED ORDER — THIAMINE HCL 100 MG/ML IJ SOLN: 100.0000 mg | Freq: Every day | INTRAMUSCULAR | Status: DC

## 2022-07-24 MED ORDER — ENOXAPARIN SODIUM 40 MG/0.4ML IJ SOSY
40.0000 mg | PREFILLED_SYRINGE | INTRAMUSCULAR | Status: DC
  Administered 2022-07-24: 40 mg via SUBCUTANEOUS
  Filled 2022-07-24: qty 0.4

## 2022-07-24 MED ORDER — SODIUM CHLORIDE 0.9 % IV SOLN: INTRAVENOUS | Status: DC

## 2022-07-24 MED ORDER — FOLIC ACID 1 MG PO TABS
1.0000 mg | ORAL_TABLET | Freq: Every day | ORAL | Status: DC
  Administered 2022-07-24 – 2022-07-29 (×6): 1 mg via ORAL
  Filled 2022-07-24 (×6): qty 1

## 2022-07-24 MED ORDER — HYDROMORPHONE HCL 1 MG/ML IJ SOLN
2.0000 mg | Freq: Once | INTRAMUSCULAR | Status: AC
  Administered 2022-07-24: 2 mg via INTRAVENOUS
  Filled 2022-07-24: qty 2

## 2022-07-24 MED ORDER — DROPERIDOL 2.5 MG/ML IJ SOLN
2.5000 mg | Freq: Once | INTRAMUSCULAR | Status: AC
  Administered 2022-07-24: 2.5 mg via INTRAVENOUS
  Filled 2022-07-24: qty 2

## 2022-07-24 MED ORDER — NICOTINE 21 MG/24HR TD PT24
21.0000 mg | MEDICATED_PATCH | Freq: Every day | TRANSDERMAL | Status: DC
  Administered 2022-07-24 – 2022-07-29 (×6): 21 mg via TRANSDERMAL
  Filled 2022-07-24 (×6): qty 1

## 2022-07-24 MED ORDER — HYDROCHLOROTHIAZIDE 12.5 MG PO TABS
12.5000 mg | ORAL_TABLET | Freq: Every day | ORAL | Status: DC
  Administered 2022-07-24 – 2022-07-26 (×3): 12.5 mg via ORAL
  Filled 2022-07-24 (×3): qty 1

## 2022-07-24 MED ORDER — HYDROMORPHONE HCL 1 MG/ML IJ SOLN
1.0000 mg | Freq: Once | INTRAMUSCULAR | Status: AC
  Administered 2022-07-24: 1 mg via INTRAVENOUS
  Filled 2022-07-24: qty 1

## 2022-07-24 MED ORDER — LORAZEPAM 0.5 MG PO TABS
0.5000 mg | ORAL_TABLET | ORAL | Status: AC | PRN
  Administered 2022-07-25: 0.5 mg via ORAL
  Filled 2022-07-24: qty 1

## 2022-07-24 MED ORDER — ONDANSETRON HCL 4 MG PO TABS: 4.0000 mg | ORAL_TABLET | Freq: Four times a day (QID) | ORAL | Status: DC | PRN

## 2022-07-24 MED ORDER — ONDANSETRON HCL 4 MG/2ML IJ SOLN
4.0000 mg | Freq: Once | INTRAMUSCULAR | Status: AC
  Administered 2022-07-24: 4 mg via INTRAVENOUS
  Filled 2022-07-24: qty 2

## 2022-07-24 MED ORDER — LORAZEPAM 1 MG PO TABS
1.0000 mg | ORAL_TABLET | ORAL | Status: AC | PRN
  Administered 2022-07-24 (×2): 1 mg via ORAL
  Filled 2022-07-24 (×2): qty 1

## 2022-07-24 MED ORDER — ADULT MULTIVITAMIN W/MINERALS CH
1.0000 | ORAL_TABLET | Freq: Every day | ORAL | Status: DC
  Administered 2022-07-24 – 2022-07-29 (×4): 1 via ORAL
  Filled 2022-07-24 (×6): qty 1

## 2022-07-24 MED ORDER — SODIUM CHLORIDE 0.9 % IV BOLUS
1000.0000 mL | Freq: Once | INTRAVENOUS | Status: AC
  Administered 2022-07-24: 1000 mL via INTRAVENOUS

## 2022-07-24 MED ORDER — THIAMINE MONONITRATE 100 MG PO TABS
100.0000 mg | ORAL_TABLET | Freq: Every day | ORAL | Status: DC
  Administered 2022-07-24 – 2022-07-29 (×6): 100 mg via ORAL
  Filled 2022-07-24 (×6): qty 1

## 2022-07-24 MED ORDER — BUPRENORPHINE HCL-NALOXONE HCL 8-2 MG SL SUBL
1.0000 | SUBLINGUAL_TABLET | Freq: Three times a day (TID) | SUBLINGUAL | Status: DC
  Administered 2022-07-24 – 2022-07-29 (×15): 1 via SUBLINGUAL
  Filled 2022-07-24 (×15): qty 1

## 2022-07-24 NOTE — ED Notes (Signed)
Pt got out of bed, took monitor off. Pt states "I just need to go to sleep, I have twins at home!". Pt walked to the bathroom with steady gait.

## 2022-07-24 NOTE — Assessment & Plan Note (Signed)
Continue Suboxone

## 2022-07-24 NOTE — Assessment & Plan Note (Signed)
Patient self reports high-volume alcohol use including several mild liquids as well as heavy hard liquor use daily Alcohol level 90 on presentation Has been a longstanding issue Patient denies any illicit drug use UDS pan negative Will place on withdrawal protocol Start thiamine and folate supplementation Follow

## 2022-07-24 NOTE — Assessment & Plan Note (Signed)
BP stable Titrate home regimen 

## 2022-07-24 NOTE — ED Provider Notes (Signed)
[facility] Provider Note    Event Date/Time   First MD Initiated Contact with Patient 07/24/22 [phone]     (approximate)   History   Abdominal Pain, Emesis, and Diarrhea   HPI  Courtney Swanson is a 27 y.o. female with history of asthma and substance abuse who presents with nausea, vomiting, and abdominal pain for the last several weeks intermittently, acutely worsened since last night, and associated with diarrhea.  The patient reports diffuse abdominal pain.  She does endorse drinking alcohol last night but no drugs.  She states she is on Suboxone.  I reviewed the past medical records.  The patient's most recent encounter was an admission for preterm labor at [facility] in August of last year.   Physical Exam   Triage Vital Signs: ED Triage Vitals [07/24/22 0921]  Enc Vitals Group     BP      Pulse      Resp      Temp      Temp src      SpO2      Weight 165 lb (74.8 kg)     Height '5\' 7"'$  (1.702 m)     Head Circumference      Peak Flow      Pain Score 10     Pain Loc      Pain Edu?      Excl. in [place]?     Most recent vital signs: Vitals:   07/24/22 0923  BP: (!) 140/87  Pulse: 83  Resp: 18  Temp: (!) 97.5 F (36.4 C)  SpO2: 100%     General: Alert and oriented, uncomfortable appearing. CV:  Good peripheral perfusion.  Resp:  Normal effort.  Abd:  Soft with diffuse tenderness.  No distention.  No peritoneal signs. Other:  No jaundice or scleral icterus.  Slightly dry mucous membranes.   ED Results / Procedures / Treatments   Labs (all labs ordered are listed, but only abnormal results are displayed) Labs Reviewed  BASIC METABOLIC PANEL - Abnormal; Notable for the following components:      Result Value   CO2 21 (*)    Glucose, Bld 142 (*)    BUN 5 (*)    Calcium 8.4 (*)    All other components within normal limits  HEPATIC FUNCTION PANEL - Abnormal; Notable for the following components:   Albumin 3.1 (*)    Alkaline Phosphatase  31 (*)    Bilirubin, Direct 0.3 (*)    All other components within normal limits  LIPASE, BLOOD - Abnormal; Notable for the following components:   Lipase 315 (*)    All other components within normal limits  CBC WITH DIFFERENTIAL/PLATELET - Abnormal; Notable for the following components:   RBC 3.17 (*)    Hemoglobin 10.6 (*)    HCT 31.6 (*)    RDW 16.3 (*)    Lymphs Abs 4.1 (*)    All other components within normal limits  ETHANOL - Abnormal; Notable for the following components:   Alcohol, Ethyl (B) 90 (*)    All other components within normal limits  URINALYSIS, ROUTINE W REFLEX MICROSCOPIC - Abnormal; Notable for the following components:   Color, Urine YELLOW (*)    APPearance CLEAR (*)    Leukocytes,Ua TRACE (*)    All other components within normal limits  URINE DRUG SCREEN, QUALITATIVE ([facility] ONLY)     EKG  ED ECG REPORT I, Arta Silence, the attending physician,  personally viewed and interpreted this ECG.  Date: 07/24/2022 EKG Time: 0928 Rate: 86 Rhythm: normal sinus rhythm QRS Axis: normal Intervals: normal ST/T Wave abnormalities: Nonspecific T wave abnormality Narrative Interpretation: no evidence of acute ischemia; no significant change when compared EKG of 11/24/2020    RADIOLOGY    PROCEDURES:  Critical Care performed: No  Procedures   MEDICATIONS ORDERED IN ED: Medications  sodium chloride 0.9 % bolus 1,000 mL (1,000 mLs Intravenous New Bag/Given 07/24/22 [id number])  HYDROmorphone (DILAUDID) injection 1 mg (1 mg Intravenous Given 07/24/22 0937)  ondansetron (ZOFRAN) injection 4 mg (4 mg Intravenous Given 07/24/22 0937)  droperidol (INAPSINE) 2.5 MG/ML injection 2.5 mg (2.5 mg Intravenous Given 07/24/22 1017)  HYDROmorphone (DILAUDID) injection 1 mg (1 mg Intravenous Given 07/24/22 1131)     IMPRESSION / MDM / ASSESSMENT AND PLAN / ED COURSE  I reviewed the triage vital signs and the nursing notes.  27 year old female with PMH as noted above presents  with several weeks of nausea and vomiting, acutely worsened since last night associated with diffuse abdominal pain and diarrhea.  On exam the vital signs are normal, the patient is uncomfortable appearing, and she is diffusely tender but with no peritoneal signs.  Differential diagnosis includes, but is not limited to, gastroenteritis, foodborne illness, alcohol-induced or other gastritis, gastroparesis, PUD, pancreatitis, other hepatobiliary cause.  We will give fluids, antiemetics, analgesia, obtain lab workup, and reassess.  Patient's presentation is most consistent with acute presentation with potential threat to life or bodily function.  ----------------------------------------- 11:46 AM on 07/24/2022 -----------------------------------------  The patient had minimal improvement with Dilaudid but did have some improvement with droperidol.  She is now having additional nausea and pain.  Lab workup shows elevated lipase consistent with acute pancreatitis.  LFTs and alk phos are unremarkable.  Ethanol level is 90.  There is no leukocytosis.  Electrolytes are normal.  The patient will need admission for further treatment and possible workup.  I consulted Dr. Ernestina Patches from the hospitalist service; based on our discussion he agreed to admit the patient.  FINAL CLINICAL IMPRESSION(S) / ED DIAGNOSES   Final diagnoses:  Intractable nausea and vomiting  Alcohol-induced acute pancreatitis, unspecified complication status     Rx / DC Orders   ED Discharge Orders     None        Note:  This document was prepared using Dragon voice recognition software and may include unintentional dictation errors.    Arta Silence, MD 07/24/22 1147

## 2022-07-24 NOTE — ED Triage Notes (Signed)
Pt to ER via EMS from home with several week h x of abdominal pain accompanied by diarrhea.  Pt reports symptoms are intermittent.  States vomiting that started this AM.  Pt has hx of ETOH abuse, last drink was around 3-4 AM today.  Pt arrives to ER with dry heaves.

## 2022-07-24 NOTE — H&P (Signed)
History and Physical    Patient: Courtney Swanson [id number] DOB: 02-15-1996 DOA: 07/24/2022 DOS: the patient was seen and examined on 07/24/2022 [facility] [place]  Patient coming from: Home  Chief Complaint:  Chief Complaint  Patient presents with   Abdominal Pain   Emesis   Diarrhea   HPI: Courtney Swanson is a 27 y.o. female with medical history significant of alcohol abuse, substance abuse, tobacco abuse, mood disorder hypertension presenting with alcohol pancreatitis.  Patient reports progressive onset of generalized abdominal pain over the past month or so.  Patient admits to heavy alcohol use on a regular basis.  Drinks at least 67 malt liquids a day as well as multiple shots of hard liquor.  1 pack/day smoker.  He is on Suboxone on regular basis.  Denies any other illicit drug use.  Has had recurrent episodes of nonbilious nonbloody emesis.  Abdominal pain fairly generalized.  No chest pain or shortness of breath.  No hemiparesis or confusion.  Symptom has progressively worsened over the past month.  No change in drinking pattern. Presented to the ER afebrile, hemodynamically stable.  White count 6.8, hemoglobin 10.6.  Creatinine 0.53.  LFTs and T. bili within normal is.  Alcohol level 90.  Urinalysis not indicative of infection.  Urine drug screen pan negative. Review of Systems: As mentioned in the history of present illness. All other systems reviewed and are negative. Past Medical History:  Diagnosis Date   Asthma    Substance use disorder    drug of choice oxycodone, last use 2021   Past Surgical History:  Procedure Laterality Date   NO PAST SURGERIES     Social History:  reports that she has been smoking cigarettes. She has a 2.00 pack-year smoking history. She has never used smokeless tobacco. She reports that she does not currently use alcohol after a past usage of about 3.0 standard drinks of alcohol per week. She reports that she does not  currently use drugs after having used the following drugs: Hydrocodone.  Allergies  Allergen Reactions   Amoxicillin Other (See Comments)    Mother told me I was allergic   Tylenol [Acetaminophen] Nausea And Vomiting    Family History  Problem Relation Age of Onset   Hypertension Mother    Diabetes Mother    Bipolar disorder Mother    Post-traumatic stress disorder Mother    Depression Mother     Prior to Admission medications   Medication Sig Start Date End Date Taking? Authorizing Provider  buprenorphine-naloxone (SUBOXONE) 8-2 mg SUBL SL tablet Place 1 tablet under the tongue 3 (three) times daily.   Yes [provider]  hydrochlorothiazide (MICROZIDE) 12.5 MG capsule Take 12.5 mg by mouth daily.   Yes [provider]  omeprazole (PRILOSEC) 20 MG capsule Take 20 mg by mouth daily.   Yes [provider]  sertraline (ZOLOFT) 50 MG tablet Take 50 mg by mouth daily.   Yes [provider]  EPINEPHrine 0.3 mg/0.3 mL IJ SOAJ injection Inject 0.3 mg into the muscle as needed for anaphylaxis. 11/24/20   Rudene Re, MD    Physical Exam: Vitals:   07/24/22 0923 07/24/22 1000 07/24/22 1030 07/24/22 1132  BP: (!) 140/87 (!) 140/95 (!) 150/92 (!) 144/90  Pulse: 83 85 87 (!) 102  Resp: '18 13  18  '$ Temp: (!) 97.5 F (36.4 C)     TempSrc: Oral     SpO2: 100% 100% 100% 100%  Weight:  Height:       Physical Exam Constitutional:      Appearance: She is normal weight.     Comments: Minimal to mild distress 2/2 abd pain   HENT:     Head: Normocephalic and atraumatic.     Mouth/Throat:     Mouth: Mucous membranes are moist.  Eyes:     Pupils: Pupils are equal, round, and reactive to light.  Cardiovascular:     Rate and Rhythm: Normal rate and regular rhythm.  Pulmonary:     Effort: Pulmonary effort is normal.     Breath sounds: Normal breath sounds.  Abdominal:     General: Bowel sounds are normal.     Comments: + generalized abd ttp     Musculoskeletal:        General: Normal range of motion.  Skin:    General: Skin is dry.  Neurological:     General: No focal deficit present.  Psychiatric:        Mood and Affect: Mood normal.     Data Reviewed:  There are no new results to review at this time. Lab Results  Component Value Date   WBC 6.8 07/24/2022   HGB 10.6 (L) 07/24/2022   HCT 31.6 (L) 07/24/2022   MCV 99.7 07/24/2022   PLT 175 [id number]   Last metabolic panel Lab Results  Component Value Date   GLUCOSE 142 (H) 07/24/2022   NA 139 07/24/2022   K 3.6 07/24/2022   CL 106 07/24/2022   CO2 21 (L) 07/24/2022   BUN 5 (L) 07/24/2022   CREATININE 0.53 07/24/2022   GFRNONAA >60 07/24/2022   CALCIUM 8.4 (L) 07/24/2022   PROT 6.9 07/24/2022   ALBUMIN 3.1 (L) 07/24/2022   BILITOT 1.0 07/24/2022   ALKPHOS 31 (L) 07/24/2022   AST 27 07/24/2022   ALT 9 07/24/2022   ANIONGAP 12 07/24/2022   Drugs of Abuse     Component Value Date/Time   LABOPIA NONE DETECTED 07/24/2022 1027   COCAINSCRNUR NONE DETECTED 07/24/2022 1027   LABBENZ NONE DETECTED 07/24/2022 1027   AMPHETMU NONE DETECTED 07/24/2022 1027   THCU NONE DETECTED 07/24/2022 1027   LABBARB NONE DETECTED 07/24/2022 1027     Assessment and Plan: * Alcoholic pancreatitis Recurrent high-volume alcohol use including multiple malt liquor today as well as multiple hard liquor shots daily now with acute pancreatitis Alcohol level 90 Lipase 315 LFTs and T. bili grossly within normal limits Ranson score preliminarily 0 Discussed alcohol cessation to which patient seems agreeable High rate maintenance IV fluids Pain control Antiemetics Advance diet as tolerated Follow   Alcohol abuse Patient self reports high-volume alcohol use including several mild liquids as well as heavy hard liquor use daily Alcohol level 90 on presentation Has been a longstanding issue Patient denies any illicit drug use UDS pan negative Will place on withdrawal  protocol Start thiamine and folate supplementation Follow  History of substance abuse (HCC) Continue Suboxone  Hypertension BP stable Titrate home regimen.  Mood disorder ([place]) No reported HI/SI Continue Zoloft      Advance Care Planning:   Code Status: Full Code   Consults: none   Family Communication: no family at the bedside   Severity of Illness: The appropriate patient status for this patient is OBSERVATION. Observation status is judged to be reasonable and necessary in order to provide the required intensity of service to ensure the patient's safety. The patient's presenting symptoms, physical exam findings, and initial radiographic and laboratory  data in the context of their medical condition is felt to place them at decreased risk for further clinical deterioration. Furthermore, it is anticipated that the patient will be medically stable for discharge from the hospital within 2 midnights of admission.   Author: Deneise Lever, MD 07/24/2022 12:40 PM  For on call review [email or web address].

## 2022-07-24 NOTE — ED Notes (Signed)
Advised nurse that patient has ready bed 

## 2022-07-24 NOTE — ED Notes (Signed)
Pt been pressing the call light every 2 minutes. Asking for more pain medicine, yelling. When RN goes in the room, pt has her eyes closed, appears to be sleeping and then wakes up saying "I can't do it, I can't take it, you need to put me to sleep" then goes right back to sleep. Pt education done repetitively, Dilaudid was just given.

## 2022-07-24 NOTE — Assessment & Plan Note (Signed)
No reported HI/SI Continue Zoloft

## 2022-07-24 NOTE — Assessment & Plan Note (Signed)
Recurrent high-volume alcohol use including multiple malt liquor today as well as multiple hard liquor shots daily now with acute pancreatitis Alcohol level 90 Lipase 315 LFTs and T. bili grossly within normal limits Ranson score preliminarily 0 Discussed alcohol cessation to which patient seems agreeable High rate maintenance IV fluids Pain control Antiemetics Advance diet as tolerated Follow

## 2022-07-25 DIAGNOSIS — E876 Hypokalemia: Secondary | ICD-10-CM | POA: Diagnosis not present

## 2022-07-25 DIAGNOSIS — Z833 Family history of diabetes mellitus: Secondary | ICD-10-CM | POA: Diagnosis not present

## 2022-07-25 DIAGNOSIS — F1010 Alcohol abuse, uncomplicated: Secondary | ICD-10-CM | POA: Diagnosis present

## 2022-07-25 DIAGNOSIS — Z880 Allergy status to penicillin: Secondary | ICD-10-CM | POA: Diagnosis not present

## 2022-07-25 DIAGNOSIS — I10 Essential (primary) hypertension: Secondary | ICD-10-CM | POA: Diagnosis present

## 2022-07-25 DIAGNOSIS — Z818 Family history of other mental and behavioral disorders: Secondary | ICD-10-CM | POA: Diagnosis not present

## 2022-07-25 DIAGNOSIS — F17210 Nicotine dependence, cigarettes, uncomplicated: Secondary | ICD-10-CM | POA: Diagnosis present

## 2022-07-25 DIAGNOSIS — K852 Alcohol induced acute pancreatitis without necrosis or infection: Secondary | ICD-10-CM | POA: Diagnosis present

## 2022-07-25 DIAGNOSIS — D72825 Bandemia: Secondary | ICD-10-CM | POA: Diagnosis present

## 2022-07-25 DIAGNOSIS — Z79899 Other long term (current) drug therapy: Secondary | ICD-10-CM | POA: Diagnosis not present

## 2022-07-25 DIAGNOSIS — F1911 Other psychoactive substance abuse, in remission: Secondary | ICD-10-CM | POA: Diagnosis not present

## 2022-07-25 DIAGNOSIS — Z8249 Family history of ischemic heart disease and other diseases of the circulatory system: Secondary | ICD-10-CM | POA: Diagnosis not present

## 2022-07-25 DIAGNOSIS — F17200 Nicotine dependence, unspecified, uncomplicated: Secondary | ICD-10-CM | POA: Diagnosis present

## 2022-07-25 DIAGNOSIS — F1120 Opioid dependence, uncomplicated: Secondary | ICD-10-CM | POA: Diagnosis present

## 2022-07-25 DIAGNOSIS — D649 Anemia, unspecified: Secondary | ICD-10-CM | POA: Diagnosis present

## 2022-07-25 DIAGNOSIS — Y904 Blood alcohol level of 80-99 mg/100 ml: Secondary | ICD-10-CM | POA: Diagnosis present

## 2022-07-25 DIAGNOSIS — F39 Unspecified mood [affective] disorder: Secondary | ICD-10-CM | POA: Diagnosis present

## 2022-07-25 LAB — CBC
HCT: 38.5 % (ref 36.0–46.0)
Hemoglobin: 12.8 g/dL (ref 12.0–15.0)
MCH: 33.9 pg (ref 26.0–34.0)
MCHC: 33.2 g/dL (ref 30.0–36.0)
MCV: 101.9 fL — ABNORMAL HIGH (ref 80.0–100.0)
Platelets: 154 10*3/uL (ref 150–400)
RBC: 3.78 MIL/uL — ABNORMAL LOW (ref 3.87–5.11)
RDW: 16.2 % — ABNORMAL HIGH (ref 11.5–15.5)
WBC: 14 10*3/uL — ABNORMAL HIGH (ref 4.0–10.5)
nRBC: 0 % (ref 0.0–0.2)

## 2022-07-25 LAB — VITAMIN B12: Vitamin B-12: 102 pg/mL — ABNORMAL LOW (ref 180–914)

## 2022-07-25 LAB — COMPREHENSIVE METABOLIC PANEL
ALT: 11 U/L (ref 0–44)
AST: 24 U/L (ref 15–41)
Albumin: 3 g/dL — ABNORMAL LOW (ref 3.5–5.0)
Alkaline Phosphatase: 31 U/L — ABNORMAL LOW (ref 38–126)
Anion gap: 10 (ref 5–15)
BUN: <5 mg/dL — ABNORMAL LOW (ref 6–20)
CO2: 25 mmol/L (ref 22–32)
Calcium: 8.1 mg/dL — ABNORMAL LOW (ref 8.9–10.3)
Chloride: 101 mmol/L (ref 98–111)
Creatinine, Ser: 0.5 mg/dL (ref 0.44–1.00)
GFR, Estimated: >60 mL/min (ref >60)
Glucose, Bld: 137 mg/dL — ABNORMAL HIGH (ref 70–99)
Potassium: 2.9 mmol/L — ABNORMAL LOW (ref 3.5–5.1)
Sodium: 136 mmol/L (ref 135–145)
Total Bilirubin: 0.8 mg/dL (ref 0.3–1.2)
Total Protein: 6.5 g/dL (ref 6.5–8.1)

## 2022-07-25 LAB — HIV ANTIBODY (ROUTINE TESTING W REFLEX): HIV Screen 4th Generation wRfx: NONREACTIVE

## 2022-07-25 MED ORDER — ORAL CARE MOUTH RINSE: 15.0000 mL | OROMUCOSAL | Status: DC | PRN

## 2022-07-25 MED ORDER — POTASSIUM CHLORIDE CRYS ER 20 MEQ PO TBCR
40.0000 meq | EXTENDED_RELEASE_TABLET | Freq: Two times a day (BID) | ORAL | Status: DC
  Filled 2022-07-25: qty 2

## 2022-07-25 MED ORDER — POTASSIUM CHLORIDE 10 MEQ/100ML IV SOLN
10.0000 meq | INTRAVENOUS | Status: AC
  Administered 2022-07-25 (×6): 10 meq via INTRAVENOUS
  Filled 2022-07-25 (×7): qty 100

## 2022-07-25 MED ORDER — OXYCODONE HCL 5 MG PO TABS
5.0000 mg | ORAL_TABLET | Freq: Once | ORAL | Status: AC
  Administered 2022-07-25: 5 mg via ORAL
  Filled 2022-07-25: qty 1

## 2022-07-25 MED ORDER — MORPHINE SULFATE (PF) 2 MG/ML IV SOLN
2.0000 mg | INTRAVENOUS | Status: DC | PRN
  Administered 2022-07-25 – 2022-07-27 (×10): 2 mg via INTRAVENOUS
  Filled 2022-07-25 (×10): qty 1

## 2022-07-25 NOTE — Assessment & Plan Note (Signed)
-  Opiate dependence -Continue Suboxone -Effects of opiates may be diminished because of opiate dependence and Suboxone but she needs pain control medication -Will stop parenteral opiates for now and transition to PO oxycodone

## 2022-07-25 NOTE — Assessment & Plan Note (Addendum)
-  She has been taking HCTZ - which has a listed side effect of pancreatitis -Will stop HCTZ -Change to amlodipine -Improving BP control

## 2022-07-25 NOTE — Assessment & Plan Note (Signed)
Continue Zoloft 

## 2022-07-25 NOTE — Assessment & Plan Note (Signed)
-  Encourage cessation.   -Patch ordered

## 2022-07-25 NOTE — Plan of Care (Signed)

## 2022-07-25 NOTE — Hospital Course (Signed)
Patient with h/o ETOH/polysubstance abuse, tobacco dependence, mood d/o, and HTN presenting with abdominal pain.  Mildly elevated lipase suggestive of acute pancreatitis.  Alcohol cessation was encouraged.  Started on CIWA.  Also on suboxone as an outpatient.

## 2022-07-25 NOTE — Progress Notes (Signed)
Progress Note   Patient: Courtney Swanson [id number] DOB: [birth year] DOA: 07/24/2022     0 DOS: the patient was seen and examined on 07/25/2022   Brief hospital course: Patient with h/o ETOH/polysubstance abuse, tobacco dependence, mood d/o, and HTN presenting with abdominal pain.  Mildly elevated lipase suggestive of acute pancreatitis.  Alcohol cessation was encouraged.  Started on CIWA.  Also on suboxone as an outpatient.  Assessment and Plan: * Alcoholic pancreatitis -Patient with recurrent high-volume alcohol use including multiple malt liquor today as well as multiple hard liquor shots daily now with acute pancreatitis -Alcohol level 90 -Lipase 315 on presentation -LFTs and T. bili grossly within normal limits -Will admit to med surg -Strict NPO for now -Aggressive IVF hydration for now with NS at 200 cc/hr -Pain control with morphine 2 mg q2h prn.   -Nausea control with Zofran -Strict alcohol cessation is encouraged  Alcohol abuse -Patient self reports high-volume alcohol use including several mild liquids as well as heavy hard liquor use daily -Alcohol level 90 on presentation -Has been a longstanding issue -Patient denies any illicit drug use, UDS negative -Will place on CIWA protocol -Start thiamine and folate supplementation -Reports high level of stress (27yo + pre-term twin infants) but she recently moved in with her mother and this is providing her with more support  Tobacco dependence -Encourage cessation.   -Patch ordered   History of substance abuse ([place]) -Opiate dependence -Continue Suboxone -Effects of morphine may be diminished because of opiate dependence and Suboxone but she needs pain control medication  Hypertension -Continue HCTZ -She does have hypokalemia which is likely related to n/v + HCTZ -If persistent hypokalemia, consider alternative medication  Mood disorder ([place]) -Continue Zoloft     Subjective: She is continuing to feel terrible  with severe abdominal pain, bloating.  She does not want to eat/drink.  She has significant life stress and uses alcohol to cope but is currently planning to never drink again.   Physical Exam: Vitals:   07/24/22 1754 07/25/22 0028 07/25/22 0048 07/25/22 0540  BP: (!) 147/88 (!) 163/100 (!) 159/105 (!) 159/103  Pulse: (!) 103 95 87 88  Resp: '19 16  16  '$ Temp: (!) 97.1 F (36.2 C) 98.4 F (36.9 C)  98.9 F (37.2 C)  TempSrc:  Oral  Oral  SpO2: 99% 100%  97%  Weight:      Height:       General:  Appears calm and comfortable and is in NAD Eyes:  EOMI, normal lids, iris ENT:  grossly normal hearing, lips & tongue, mildly dry mm Neck:  no LAD, masses or thyromegaly Cardiovascular:  RRR, no m/r/g. No LE edema.  Respiratory:   CTA bilaterally with no wheezes/rales/rhonchi.  Normal respiratory effort. Abdomen:  mildly distended, marked epigastric and upper TTP with diffuse tenderness to a lesser extent, hypoactive but present BS Skin:  no rash or induration seen on limited exam Musculoskeletal:  grossly normal tone BUE/BLE, good ROM, no bony abnormality Psychiatric:  blunted mood and affect, speech fluent and appropriate, AOx3 Neurologic:  CN 2-12 grossly intact, moves all extremities in coordinated fashion   Radiological Exams on Admission: Independently reviewed - see discussion in A/P where applicable  No results found.  EKG: not done   Labs on Admission: I have personally reviewed the available labs and imaging studies at the time of the admission.  Pertinent labs:    K+ 2.9 Glucose 137 Albumin 3.0 Lipase 315 on presentation WBC 14 HIV  negative UA: trace LE ETOH 90 UDS negative  Family Communication: None present; she is capable of communicating with family at this time  Disposition: Status is: Inpatient  Admit - It is my clinical opinion that admission to INPATIENT is reasonable and necessary because of the expectation that this patient will require hospital care  that crosses at least 2 midnights to treat this condition based on the medical complexity of the problems presented.  Given the aforementioned information, the predictability of an adverse outcome is felt to be significant.    Planned Discharge Destination: Home   Time spent: 50 minutes  Author: Karmen Bongo, MD 07/25/2022 3:25 PM  For on call review [email or web address].

## 2022-07-25 NOTE — Assessment & Plan Note (Signed)
-  Patient with recurrent high-volume alcohol use including multiple malt liquor today as well as multiple hard liquor shots daily now with acute pancreatitis -Alcohol level 90 -Lipase 315 on presentation -LFTs and T. bili grossly within normal limits -Will admit to med surg -Strict NPO for now -Aggressive IVF hydration for now with NS at 200 cc/hr -Pain control with morphine 2 mg q2h prn.   -Nausea control with Zofran -Strict alcohol cessation is encouraged

## 2022-07-25 NOTE — Assessment & Plan Note (Signed)
-  Patient self reports high-volume alcohol use including several mild liquids as well as heavy hard liquor use daily -Alcohol level 90 on presentation -Has been a longstanding issue -Patient denies any illicit drug use, UDS negative -Will place on CIWA protocol -Start thiamine and folate supplementation -Reports high level of stress (27yo + pre-term twin infants) but she recently moved in with her mother and this is providing her with more support

## 2022-07-26 ENCOUNTER — Encounter: Payer: Self-pay | Admitting: Internal Medicine

## 2022-07-26 ENCOUNTER — Inpatient Hospital Stay: Payer: Medicaid Other

## 2022-07-26 DIAGNOSIS — K852 Alcohol induced acute pancreatitis without necrosis or infection: Secondary | ICD-10-CM | POA: Diagnosis not present

## 2022-07-26 LAB — COMPREHENSIVE METABOLIC PANEL
ALT: 8 U/L (ref 0–44)
AST: 13 U/L — ABNORMAL LOW (ref 15–41)
Albumin: 2.5 g/dL — ABNORMAL LOW (ref 3.5–5.0)
Alkaline Phosphatase: 34 U/L — ABNORMAL LOW (ref 38–126)
Anion gap: 9 (ref 5–15)
BUN: <5 mg/dL — ABNORMAL LOW (ref 6–20)
CO2: 25 mmol/L (ref 22–32)
Calcium: 7.8 mg/dL — ABNORMAL LOW (ref 8.9–10.3)
Chloride: 102 mmol/L (ref 98–111)
Creatinine, Ser: 0.44 mg/dL (ref 0.44–1.00)
GFR, Estimated: >60 mL/min (ref >60)
Glucose, Bld: 91 mg/dL (ref 70–99)
Potassium: 2.8 mmol/L — ABNORMAL LOW (ref 3.5–5.1)
Sodium: 136 mmol/L (ref 135–145)
Total Bilirubin: 0.8 mg/dL (ref 0.3–1.2)
Total Protein: 5.7 g/dL — ABNORMAL LOW (ref 6.5–8.1)

## 2022-07-26 LAB — CBC WITH DIFFERENTIAL/PLATELET
Abs Immature Granulocytes: 0.1 10*3/uL — ABNORMAL HIGH (ref 0.00–0.07)
Basophils Absolute: 0 10*3/uL (ref 0.0–0.1)
Basophils Relative: 0 %
Eosinophils Absolute: 0 10*3/uL (ref 0.0–0.5)
Eosinophils Relative: 0 %
HCT: 32 % — ABNORMAL LOW (ref 36.0–46.0)
Hemoglobin: 10.8 g/dL — ABNORMAL LOW (ref 12.0–15.0)
Immature Granulocytes: 1 %
Lymphocytes Relative: 9 %
Lymphs Abs: 1.5 10*3/uL (ref 0.7–4.0)
MCH: 33.9 pg (ref 26.0–34.0)
MCHC: 33.8 g/dL (ref 30.0–36.0)
MCV: 100.3 fL — ABNORMAL HIGH (ref 80.0–100.0)
Monocytes Absolute: 0.7 10*3/uL (ref 0.1–1.0)
Monocytes Relative: 4 %
Neutro Abs: 13.5 10*3/uL — ABNORMAL HIGH (ref 1.7–7.7)
Neutrophils Relative %: 86 %
Platelets: 152 10*3/uL (ref 150–400)
RBC: 3.19 MIL/uL — ABNORMAL LOW (ref 3.87–5.11)
RDW: 15.7 % — ABNORMAL HIGH (ref 11.5–15.5)
WBC: 15.8 10*3/uL — ABNORMAL HIGH (ref 4.0–10.5)
nRBC: 0 % (ref 0.0–0.2)

## 2022-07-26 MED ORDER — AMLODIPINE BESYLATE 5 MG PO TABS
5.0000 mg | ORAL_TABLET | Freq: Every day | ORAL | Status: DC
  Administered 2022-07-27 – 2022-07-29 (×3): 5 mg via ORAL
  Filled 2022-07-26 (×3): qty 1

## 2022-07-26 MED ORDER — IOHEXOL 300 MG/ML  SOLN
100.0000 mL | Freq: Once | INTRAMUSCULAR | Status: AC | PRN
  Administered 2022-07-26: 100 mL via INTRAVENOUS

## 2022-07-26 NOTE — Progress Notes (Addendum)
Progress Note   Patient: Courtney Swanson [id number] DOB: [birth year] DOA: 07/24/2022     1 DOS: the patient was seen and examined on 07/26/2022   Brief hospital course: Patient with h/o ETOH/polysubstance abuse, tobacco dependence, mood d/o, and HTN presenting with abdominal pain.  Mildly elevated lipase suggestive of acute pancreatitis.  Alcohol cessation was encouraged.  Started on CIWA.  Also on suboxone as an outpatient.  Assessment and Plan: * Alcoholic pancreatitis -Patient with recurrent high-volume alcohol use including multiple malt liquor today as well as multiple hard liquor shots daily now with acute pancreatitis -Alcohol level 90 -Lipase 315 on presentation -LFTs and T. bili grossly within normal limits -Admitted to med surg -Strict NPO for now -Aggressive IVF hydration for now with NS at 200 cc/hr -Pain control with morphine 2 mg q2h prn.   -Nausea control with Zofran -Strict alcohol cessation is encouraged -No improvement and so CT A/P ordered which just confirms acute pancreatitis without apparent necrosis or pseudocysts  Alcohol abuse -Patient self reports high-volume alcohol use including several mild liquids as well as heavy hard liquor use daily -Alcohol level 90 on presentation -Has been a longstanding issue -Patient denies any illicit drug use, UDS negative -Will place on CIWA protocol -Start thiamine and folate supplementation -Reports high level of stress (27yo + pre-term twin infants) but she recently moved in with her mother and this is providing her with more support  Tobacco dependence -Encourage cessation.   -Patch ordered   History of substance abuse ([place]) -Opiate dependence -Continue Suboxone -Effects of morphine may be diminished because of opiate dependence and Suboxone but she needs pain control medication  Hypertension -She has been taking HCTZ - which has a listed side effect of pancreatitis -Will stop HCTZ -Change to amlodipine  Mood  disorder ([facility]) -Continue Zoloft     Subjective: The patient reports that she is minimally better.  Still with diffuse abdominal bloating and pain with radiation into her back.  There are various drinks in her room, although she has a strict NPO order.  Physical Exam: Vitals:   07/25/22 0540 07/25/22 1552 07/25/22 2159 07/26/22 0838  BP: (!) 159/103 (!) 162/96 (!) 146/99 (!) 150/97  Pulse: 88 82 86 89  Resp: '16 18 18 16  '$ Temp: 98.9 F (37.2 C) 98.2 F (36.8 C) 97.6 F (36.4 C) 97.8 F (36.6 C)  TempSrc: Oral  Oral   SpO2: 97% 93% 98% 95%  Weight:      Height:       General:  Appears calm and comfortable and is in NAD Eyes:  EOMI, normal lids, iris ENT:  grossly normal hearing, lips & tongue, mmm Neck:  no LAD, masses or thyromegaly Cardiovascular:  RRR, no m/r/g. No LE edema.  Respiratory:   CTA bilaterally with no wheezes/rales/rhonchi.  Normal respiratory effort. Abdomen:  soft, mildly distended, moderate diffuse TTP Back:   normal alignment, no CVAT Skin:  no rash or induration seen on limited exam Musculoskeletal:  grossly normal tone BUE/BLE, good ROM, no bony abnormality Psychiatric:  blunted mood and affect, speech fluent and appropriate, AOx3 Neurologic:  CN 2-12 grossly intact, moves all extremities in coordinated fashion   Radiological Exams on Admission: Independently reviewed - see discussion in A/P where applicable  CT ABDOMEN PELVIS W CONTRAST  Result Date: 07/26/2022 CLINICAL DATA:  Pancreatitis. EXAM: CT ABDOMEN AND PELVIS WITH CONTRAST TECHNIQUE: Multidetector CT imaging of the abdomen and pelvis was performed using the standard protocol following bolus administration of  intravenous contrast. RADIATION DOSE REDUCTION: This exam was performed according to the departmental dose-optimization program which includes automated exposure control, adjustment of the mA and/or kV according to patient size and/or use of iterative reconstruction technique. CONTRAST:   12m OMNIPAQUE IOHEXOL 300 MG/ML  SOLN COMPARISON:  CT abdomen pelvis dated 04/16/2018. FINDINGS: Lower chest: Partially visualized moderate bilateral pleural effusions. There is partial compressive atelectasis of the lower lobes versus pneumonia. No intra-abdominal free air.  Small ascites. Hepatobiliary: The liver is unremarkable. No biliary dilatation. The gallbladder is unremarkable. Pancreas: There is inflammatory changes of the pancreas consistent with acute pancreatitis. No drainable fluid collection/abscess or pseudocyst at this time. Spleen: Normal in size without focal abnormality. Adrenals/Urinary Tract: The adrenal glands unremarkable. Six the kidneys, visualized ureters and urinary bladder appear unremarkable. Stomach/Bowel: There is diffuse thickened appearance of the colon, likely related to ascites. Colitis is less likely. Clinical correlation is recommended. There is no bowel obstruction. Six the appendix is normal. Vascular/Lymphatic: The abdominal aorta and IVC are unremarkable. The SMV, splenic vein, and main portal vein are patent. No portal venous gas. There is no adenopathy. Reproductive: The uterus and ovaries are grossly unremarkable. A birth control vaginal ring noted. Other: Mild edema of the anterior abdominal wall. Musculoskeletal: No acute or significant osseous findings. IMPRESSION: 1. Acute pancreatitis. No drainable fluid collection/abscess or pseudocyst at this time. 2. Partially visualized moderate bilateral pleural effusions with partial compressive atelectasis of the lower lobes versus pneumonia. 3. Small ascites. 4. Diffuse thickened appearance of the colon, likely related to ascites. Colitis is less likely. No bowel obstruction. Normal appendix. Electronically Signed   By: AAnner CreteM.D.   On: 07/26/2022 16:12    EKG: none   Labs on Admission: I have personally reviewed the available labs and imaging studies at the time of the admission.  Pertinent labs:    K+  2.9 Albumin 2.5 WBC 15.8 Hgb 10.8  Family Communication: None present; I called and spoke with her mother by telephone in the afternoon  Disposition: Status is: Inpatient Remains inpatient appropriate because: ongoing symptoms  Planned Discharge Destination: Home    Time spent: 50 minutes  Author: JKarmen Bongo MD 07/26/2022 4:23 PM  For on call review [email or web address]

## 2022-07-27 LAB — CBC WITH DIFFERENTIAL/PLATELET
Abs Immature Granulocytes: 0.13 10*3/uL — ABNORMAL HIGH (ref 0.00–0.07)
Basophils Absolute: 0 10*3/uL (ref 0.0–0.1)
Basophils Relative: 0 %
Eosinophils Absolute: 0.1 10*3/uL (ref 0.0–0.5)
Eosinophils Relative: 1 %
HCT: 30.7 % — ABNORMAL LOW (ref 36.0–46.0)
Hemoglobin: 10.7 g/dL — ABNORMAL LOW (ref 12.0–15.0)
Immature Granulocytes: 1 %
Lymphocytes Relative: 9 %
Lymphs Abs: 1.4 10*3/uL (ref 0.7–4.0)
MCH: 34.3 pg — ABNORMAL HIGH (ref 26.0–34.0)
MCHC: 34.9 g/dL (ref 30.0–36.0)
MCV: 98.4 fL (ref 80.0–100.0)
Monocytes Absolute: 1.1 10*3/uL — ABNORMAL HIGH (ref 0.1–1.0)
Monocytes Relative: 7 %
Neutro Abs: 13.2 10*3/uL — ABNORMAL HIGH (ref 1.7–7.7)
Neutrophils Relative %: 82 %
Platelets: 174 10*3/uL (ref 150–400)
RBC: 3.12 MIL/uL — ABNORMAL LOW (ref 3.87–5.11)
RDW: 15.8 % — ABNORMAL HIGH (ref 11.5–15.5)
WBC: 16 10*3/uL — ABNORMAL HIGH (ref 4.0–10.5)
nRBC: 0 % (ref 0.0–0.2)

## 2022-07-27 LAB — BASIC METABOLIC PANEL
Anion gap: 13 (ref 5–15)
BUN: <5 mg/dL — ABNORMAL LOW (ref 6–20)
CO2: 20 mmol/L — ABNORMAL LOW (ref 22–32)
Calcium: 7.6 mg/dL — ABNORMAL LOW (ref 8.9–10.3)
Chloride: 100 mmol/L (ref 98–111)
Creatinine, Ser: 0.48 mg/dL (ref 0.44–1.00)
GFR, Estimated: >60 mL/min (ref >60)
Glucose, Bld: 85 mg/dL (ref 70–99)
Potassium: 2.4 mmol/L — CL (ref 3.5–5.1)
Sodium: 133 mmol/L — ABNORMAL LOW (ref 135–145)

## 2022-07-27 MED ORDER — POTASSIUM CHLORIDE CRYS ER 20 MEQ PO TBCR
40.0000 meq | EXTENDED_RELEASE_TABLET | Freq: Once | ORAL | Status: AC
  Administered 2022-07-28: 40 meq via ORAL
  Filled 2022-07-27: qty 2

## 2022-07-27 MED ORDER — OXYCODONE HCL 5 MG PO TABS
5.0000 mg | ORAL_TABLET | ORAL | Status: DC | PRN
  Administered 2022-07-27 – 2022-07-29 (×11): 5 mg via ORAL
  Filled 2022-07-27 (×11): qty 1

## 2022-07-27 MED ORDER — MORPHINE SULFATE (PF) 2 MG/ML IV SOLN
2.0000 mg | INTRAVENOUS | Status: DC | PRN
  Administered 2022-07-27: 2 mg via INTRAVENOUS
  Filled 2022-07-27: qty 1

## 2022-07-27 MED ORDER — POTASSIUM CHLORIDE CRYS ER 20 MEQ PO TBCR
40.0000 meq | EXTENDED_RELEASE_TABLET | Freq: Once | ORAL | Status: AC
  Administered 2022-07-27: 40 meq via ORAL
  Filled 2022-07-27: qty 2

## 2022-07-27 MED ORDER — POTASSIUM CHLORIDE 10 MEQ/100ML IV SOLN
10.0000 meq | INTRAVENOUS | Status: AC
  Administered 2022-07-27 (×6): 10 meq via INTRAVENOUS
  Filled 2022-07-27 (×6): qty 100

## 2022-07-27 NOTE — Progress Notes (Signed)
Progress Note   Patient: Courtney Swanson [id number] DOB: May 31, 1995 DOA: 07/24/2022     2 DOS: the patient was seen and examined on 07/27/2022   Brief hospital course: Patient with h/o ETOH/polysubstance abuse, tobacco dependence, mood d/o, and HTN presenting with abdominal pain.  Mildly elevated lipase suggestive of acute pancreatitis.  Alcohol cessation was encouraged.  Started on CIWA.  Also on suboxone as an outpatient.  Patient with uncontrolled symptoms.  Abdominal CT ordered and shows uncomplicated pancreatitis.  Assessment and Plan: * Alcoholic pancreatitis -Patient with recurrent high-volume alcohol use including multiple malt liquor today as well as multiple hard liquor shots daily now with acute pancreatitis -Alcohol level 90 -Lipase 315 on presentation -LFTs and T. bili grossly within normal limits -Admitted to med surg -Aggressive IVF hydration on admission with NS at 200 cc/hr -Pain control with morphine 2 mg q2h prn on admission -> q4h prn -> will now transition to PO oxycodone in an effort to advance care   -Nausea control with Zofran -Strict alcohol cessation is encouraged -No improvement and so CT A/P ordered which just confirmed acute pancreatitis without apparent necrosis or pseudocysts -She is asking for a diet so will decrease IVF to 100 cc/hr and start clear liquids  Alcohol abuse -Patient self reports high-volume alcohol use including several mild liquids as well as heavy hard liquor use daily -Alcohol level 90 on presentation -Has been a longstanding issue -Patient denies any illicit drug use, UDS negative -Will place on CIWA protocol -Start thiamine and folate supplementation -Reports high level of stress (27yo + pre-term twin infants) but she recently moved in with her mother and this is providing her with more support  Tobacco dependence -Encourage cessation.   -Patch ordered   History of substance abuse ([place]) -Opiate dependence -Continue  Suboxone -Effects of opiates may be diminished because of opiate dependence and Suboxone but she needs pain control medication -Will stop parenteral opiates for now and transition to PO oxycodone   Hypertension -She has been taking HCTZ - which has a listed side effect of pancreatitis -Will stop HCTZ -Change to amlodipine -Improving BP control  Mood disorder ([facility]) -Continue Zoloft     Subjective: She reports persistent but mildly improved pain - still abdominal pain with radiation to the back.  No n/v, wants a diet.    Physical Exam: Vitals:   07/26/22 2031 07/27/22 0508 07/27/22 0811 07/27/22 0938  BP: (!) 150/90 (!) 146/94 (!) 164/93 (!) 140/98  Pulse: 94 92 88 96  Resp: '18 18 18   '$ Temp: 99.4 F (37.4 C) 98 F (36.7 C) 99 F (37.2 C)   TempSrc:      SpO2: 95% 93% 93%   Weight:      Height:       General:  Appears calm and comfortable and is in NAD Eyes:  EOMI, normal lids, iris ENT:  grossly normal hearing, lips & tongue, mmm Neck:  no LAD, masses or thyromegaly Cardiovascular:  RRR, no m/r/g. No LE edema.  Respiratory:   CTA bilaterally with no wheezes/rales/rhonchi.  Normal respiratory effort. Abdomen:  soft, mildly distended, mild diffuse TTP Back:   normal alignment, no CVAT Skin:  no rash or induration seen on limited exam Musculoskeletal:  grossly normal tone BUE/BLE, good ROM, no bony abnormality Psychiatric:  blunted mood and affect, speech fluent and appropriate, AOx3 Neurologic:  CN 2-12 grossly intact, moves all extremities in coordinated fashion   Radiological Exams on Admission: Independently reviewed - see discussion  in A/P where applicable  CT ABDOMEN PELVIS W CONTRAST  Result Date: 07/26/2022 CLINICAL DATA:  Pancreatitis. EXAM: CT ABDOMEN AND PELVIS WITH CONTRAST TECHNIQUE: Multidetector CT imaging of the abdomen and pelvis was performed using the standard protocol following bolus administration of intravenous contrast. RADIATION DOSE REDUCTION:  This exam was performed according to the departmental dose-optimization program which includes automated exposure control, adjustment of the mA and/or kV according to patient size and/or use of iterative reconstruction technique. CONTRAST:  161m OMNIPAQUE IOHEXOL 300 MG/ML  SOLN COMPARISON:  CT abdomen pelvis dated 04/16/2018. FINDINGS: Lower chest: Partially visualized moderate bilateral pleural effusions. There is partial compressive atelectasis of the lower lobes versus pneumonia. No intra-abdominal free air.  Small ascites. Hepatobiliary: The liver is unremarkable. No biliary dilatation. The gallbladder is unremarkable. Pancreas: There is inflammatory changes of the pancreas consistent with acute pancreatitis. No drainable fluid collection/abscess or pseudocyst at this time. Spleen: Normal in size without focal abnormality. Adrenals/Urinary Tract: The adrenal glands unremarkable. Six the kidneys, visualized ureters and urinary bladder appear unremarkable. Stomach/Bowel: There is diffuse thickened appearance of the colon, likely related to ascites. Colitis is less likely. Clinical correlation is recommended. There is no bowel obstruction. Six the appendix is normal. Vascular/Lymphatic: The abdominal aorta and IVC are unremarkable. The SMV, splenic vein, and main portal vein are patent. No portal venous gas. There is no adenopathy. Reproductive: The uterus and ovaries are grossly unremarkable. A birth control vaginal ring noted. Other: Mild edema of the anterior abdominal wall. Musculoskeletal: No acute or significant osseous findings. IMPRESSION: 1. Acute pancreatitis. No drainable fluid collection/abscess or pseudocyst at this time. 2. Partially visualized moderate bilateral pleural effusions with partial compressive atelectasis of the lower lobes versus pneumonia. 3. Small ascites. 4. Diffuse thickened appearance of the colon, likely related to ascites. Colitis is less likely. No bowel obstruction. Normal  appendix. Electronically Signed   By: AAnner CreteM.D.   On: 07/26/2022 16:12    EKG: none since admission   Labs on Admission: I have personally reviewed the available labs and imaging studies at the time of the admission.  Pertinent labs:  CBC and BMP pending  Family Communication: None present; she is capable of communicating with family at this time  Disposition: Status is: Inpatient Remains inpatient appropriate because: ongoing IVF, progressing to clear liquids  Planned Discharge Destination: Home    Time spent: 35 minutes  Author: JKarmen Bongo MD 07/27/2022 3:03 PM  For on call review [email or web address]

## 2022-07-27 NOTE — TOC Progression Note (Signed)
Transition of Care [facility]) - Progression Note    Patient Details  Name: Courtney Swanson MRN: [id number] Date of Birth: [birth year]  [facility]) CM/SW Contact  Gerilyn Pilgrim, LCSW Phone Number: 07/27/2022, 10:29 AM  Clinical Narrative:   CSW spoke with patient regarding substance use resources. Pt reports she has been connecting with a counselor at [employer] and would like to continue doing this. Pt states she started drinking when her two babies were born pre term and she coped through alcohol. Pt currently lives with her mom and has support through mom and wants to continue services with her current therapist for substance use. Pt reports she is currently in a lot of pain and would like to see RN to see if she is able to eat.          Expected Discharge Plan and Services                                               Social Determinants of Health (SDOH) Interventions SDOH Screenings   Depression (PHQ2-9): Low Risk  (07/28/2021)  Tobacco Use: High Risk (07/26/2022)    Readmission Risk Interventions     No data to display

## 2022-07-28 DIAGNOSIS — F17200 Nicotine dependence, unspecified, uncomplicated: Secondary | ICD-10-CM

## 2022-07-28 DIAGNOSIS — E876 Hypokalemia: Secondary | ICD-10-CM

## 2022-07-28 LAB — CBC WITH DIFFERENTIAL/PLATELET
Abs Immature Granulocytes: 0.04 10*3/uL (ref 0.00–0.07)
Basophils Absolute: 0 10*3/uL (ref 0.0–0.1)
Basophils Relative: 0 %
Eosinophils Absolute: 0.2 10*3/uL (ref 0.0–0.5)
Eosinophils Relative: 1 %
HCT: 29 % — ABNORMAL LOW (ref 36.0–46.0)
Hemoglobin: 10.1 g/dL — ABNORMAL LOW (ref 12.0–15.0)
Immature Granulocytes: 0 %
Lymphocytes Relative: 13 %
Lymphs Abs: 1.8 10*3/uL (ref 0.7–4.0)
MCH: 34.2 pg — ABNORMAL HIGH (ref 26.0–34.0)
MCHC: 34.8 g/dL (ref 30.0–36.0)
MCV: 98.3 fL (ref 80.0–100.0)
Monocytes Absolute: 1.2 10*3/uL — ABNORMAL HIGH (ref 0.1–1.0)
Monocytes Relative: 8 %
Neutro Abs: 10.6 10*3/uL — ABNORMAL HIGH (ref 1.7–7.7)
Neutrophils Relative %: 78 %
Platelets: 187 10*3/uL (ref 150–400)
RBC: 2.95 MIL/uL — ABNORMAL LOW (ref 3.87–5.11)
RDW: 15.3 % (ref 11.5–15.5)
WBC: 13.7 10*3/uL — ABNORMAL HIGH (ref 4.0–10.5)
nRBC: 0 % (ref 0.0–0.2)

## 2022-07-28 LAB — COMPREHENSIVE METABOLIC PANEL
ALT: 8 U/L (ref 0–44)
AST: 14 U/L — ABNORMAL LOW (ref 15–41)
Albumin: 2.4 g/dL — ABNORMAL LOW (ref 3.5–5.0)
Alkaline Phosphatase: 47 U/L (ref 38–126)
Anion gap: 6 (ref 5–15)
BUN: <5 mg/dL — ABNORMAL LOW (ref 6–20)
CO2: 26 mmol/L (ref 22–32)
Calcium: 8 mg/dL — ABNORMAL LOW (ref 8.9–10.3)
Chloride: 104 mmol/L (ref 98–111)
Creatinine, Ser: 0.43 mg/dL — ABNORMAL LOW (ref 0.44–1.00)
GFR, Estimated: >60 mL/min (ref >60)
Glucose, Bld: 118 mg/dL — ABNORMAL HIGH (ref 70–99)
Potassium: 2.7 mmol/L — CL (ref 3.5–5.1)
Sodium: 136 mmol/L (ref 135–145)
Total Bilirubin: 0.7 mg/dL (ref 0.3–1.2)
Total Protein: 5.8 g/dL — ABNORMAL LOW (ref 6.5–8.1)

## 2022-07-28 LAB — LIPASE, BLOOD: Lipase: 82 U/L — ABNORMAL HIGH (ref 11–51)

## 2022-07-28 LAB — MAGNESIUM: Magnesium: 1.4 mg/dL — ABNORMAL LOW (ref 1.7–2.4)

## 2022-07-28 MED ORDER — POTASSIUM CHLORIDE 10 MEQ/100ML IV SOLN
10.0000 meq | INTRAVENOUS | Status: AC
  Administered 2022-07-28 (×4): 10 meq via INTRAVENOUS
  Filled 2022-07-28 (×4): qty 100

## 2022-07-28 MED ORDER — MAGNESIUM SULFATE 4 GM/100ML IV SOLN
4.0000 g | Freq: Once | INTRAVENOUS | Status: AC
  Administered 2022-07-28: 4 g via INTRAVENOUS
  Filled 2022-07-28: qty 100

## 2022-07-28 MED ORDER — POTASSIUM CHLORIDE CRYS ER 20 MEQ PO TBCR
40.0000 meq | EXTENDED_RELEASE_TABLET | Freq: Four times a day (QID) | ORAL | Status: AC
  Administered 2022-07-28 (×2): 40 meq via ORAL
  Filled 2022-07-28 (×2): qty 2

## 2022-07-28 NOTE — Progress Notes (Signed)
       CROSS COVER NOTE  NAME: Courtney Swanson MRN: [id number] DOB : Nov 30, 1995 ATTENDING PHYSICIAN: Karmen Bongo, MD    Date of Service   07/28/2022   HPI/Events of Note   Notified of critical K--> 2.7 improved from 2.4 yesterday evening.  Interventions   Assessment/Plan: 40 mEQ PO K x2 40 mEQ IV K      To reach the provider On-Call:   7AM- 7PM see care teams to locate the attending and reach out to them via [email or web address] [email or web address] 7PM-7AM contact night-coverage If you still have difficulty reaching the appropriate provider, please page the [facility] (Director on Call) for [facility] on amion for assistance  This document was prepared using Systems analyst and may include unintentional dictation errors.  Neomia Glass DNP, MBA, FNP-BC, PMHNP-BC Nurse Practitioner [facility] Pager [phone]

## 2022-07-28 NOTE — Progress Notes (Signed)
PROGRESS NOTE  Courtney Swanson [id number] DOB: Oct 29, 1995   PCP: [facility]  Patient is from: Home  DOA: 07/24/2022 LOS: 3  Chief complaints Chief Complaint  Patient presents with   Abdominal Pain   Emesis   Diarrhea     Brief Narrative / Interim history: 27 year old F with PMH of polysubstance abuse including EtOH, tobacco and opiate) currently on Suboxone), mood disorder and HTN presenting with abdominal pain and admitted for acute alcoholic pancreatitis without complication.  Lipase elevated to 315.  EtOH level was 90.   Subjective: Seen and examined earlier this morning.  No major events overnight of this morning.  Feels "better" but rates her pain 7/10.  She thinks pain is probably due to hunger.  Likes to eat.  Denies nausea or vomiting.    Objective: Vitals:   07/27/22 1618 07/27/22 2041 07/28/22 0452 07/28/22 0811  BP: (!) 149/86 (!) 141/91 (!) 147/103 (!) 143/96  Pulse: 95 98 93 91  Resp: '18 20 18 18  '$ Temp: 98.6 F (37 C) 98.5 F (36.9 C) 98.5 F (36.9 C) 98.5 F (36.9 C)  TempSrc:  Oral    SpO2: 96% 94% 94% 94%  Weight:      Height:        Examination:  GENERAL: No apparent distress.  Nontoxic. HEENT: MMM.  Vision and hearing grossly intact.  NECK: Supple.  No apparent JVD.  RESP:  No IWOB.  Fair aeration bilaterally. CVS:  RRR. Heart sounds normal.  ABD/GI/GU: BS+. Abd soft, NTND.  MSK/EXT:  Moves extremities. No apparent deformity. No edema.  SKIN: no apparent skin lesion or wound NEURO: Awake, alert and oriented appropriately.  No apparent focal neuro deficit. PSYCH: Calm. Normal affect.   Procedures:  None  Microbiology summarized: None  Assessment and plan: Principal Problem:   Alcoholic pancreatitis Active Problems:   Alcohol abuse   Mood disorder ([place])   Hypertension   History of substance abuse ([place])   Tobacco dependence  Alcoholic pancreatitis: CT suggests acute pancreatitis without complication.   Lipase elevated to 315 and improved.  EtOH level 90.  Improving. -Advance diet to full liquid -Continue IV fluid and antiemetics -Pain control with p.o. oxycodone and home Suboxone -Ambulate in the hall.  Alcohol abuse: Admits to high-volume EtOH. EtOH level 90 on presentation. -Encourage diet cessation -Continue CIWA -Continue multivitamin, thiamine and folic acid -TOC consult for resources   Tobacco dependence -Encouraged cessation.   -Patch ordered    History of substance abuse/opioid dependence on Suboxone -Continue on Suboxone   Hypertension: Improved. -Stop HCTZ on discharge given risk for pancreatitis. -Started on amlodipine this hospitalization   Mood disorder ([place]) -Continue Zoloft  Hypokalemia/hypomagnesemia -Replenish and recheck.  Normocytic anemia: Relatively stable. -Monitor as needed    Body mass index is 25.84 kg/m.          DVT prophylaxis:  SCDs Start: 07/24/22 1227  Code Status: Full code Family Communication: None at bedside Level of care: Med-Surg Status is: Inpatient Remains inpatient appropriate because: Acute pancreatitis and significant electrolyte derangements   Final disposition: Home in the next 24 to 48 hours Consultants:  None  35 minutes with more than 50% spent in reviewing records, counseling patient/family and coordinating care.   Sch Meds:  Scheduled Meds:  amLODipine  5 mg Oral Daily   buprenorphine-naloxone  1 tablet Sublingual TID   folic acid  1 mg Oral Daily   multivitamin with minerals  1 tablet Oral Daily  nicotine  21 mg Transdermal Daily   potassium chloride  40 mEq Oral Q6H   sertraline  50 mg Oral Daily   thiamine  100 mg Oral Daily   Or   thiamine  100 mg Intravenous Daily   Continuous Infusions:  sodium chloride 100 mL/hr at 07/28/22 1059   magnesium sulfate bolus IVPB 4 g (07/28/22 1206)   PRN Meds:.ondansetron **OR** ondansetron (ZOFRAN) IV, mouth rinse,  oxyCODONE  Antimicrobials: Anti-infectives (From admission, onward)    None        I have personally reviewed the following labs and images: CBC: Recent Labs  Lab 07/24/22 0931 07/24/22 1409 07/25/22 0348 07/26/22 0450 07/27/22 1605 07/28/22 0404  WBC 6.8 14.0* 14.0* 15.8* 16.0* 13.7*  NEUTROABS 2.4  --   --  13.5* 13.2* 10.6*  HGB 10.6* 11.8* 12.8 10.8* 10.7* 10.1*  HCT 31.6* 34.3* 38.5 32.0* 30.7* 29.0*  MCV 99.7 100.3* 101.9* 100.3* 98.4 98.3  PLT 175 172 154 152 174 187   BMP &GFR Recent Labs  Lab 07/24/22 0931 07/25/22 0348 07/26/22 0450 07/27/22 1605 07/28/22 0403 07/28/22 0404  NA 139 136 136 133*  --  136  K 3.6 2.9* 2.8* 2.4*  --  2.7*  CL 106 101 102 100  --  104  CO2 21* 25 25 20*  --  26  GLUCOSE 142* 137* 91 85  --  118*  BUN 5* <5* <5* <5*  --  <5*  CREATININE 0.53 0.50 0.44 0.48  --  0.43*  CALCIUM 8.4* 8.1* 7.8* 7.6*  --  8.0*  MG  --   --   --   --  1.4*  --    Estimated Creatinine Clearance: 111.6 mL/min (A) (by C-G formula based on SCr of 0.43 mg/dL (L)). Liver & Pancreas: Recent Labs  Lab 07/24/22 0931 07/25/22 0348 07/26/22 0450 07/28/22 0404  AST 27 24 13* 14*  ALT '9 11 8 8  '$ ALKPHOS 31* 31* 34* 47  BILITOT 1.0 0.8 0.8 0.7  PROT 6.9 6.5 5.7* 5.8*  ALBUMIN 3.1* 3.0* 2.5* 2.4*   Recent Labs  Lab 07/24/22 0931 07/28/22 0403  LIPASE 315* 82*   No results for input(s): "AMMONIA" in the last 168 hours. Diabetic: No results for input(s): "HGBA1C" in the last 72 hours. No results for input(s): "GLUCAP" in the last 168 hours. Cardiac Enzymes: No results for input(s): "CKTOTAL", "CKMB", "CKMBINDEX", "TROPONINI" in the last 168 hours. No results for input(s): "PROBNP" in the last 8760 hours. Coagulation Profile: No results for input(s): "INR", "PROTIME" in the last 168 hours. Thyroid Function Tests: No results for input(s): "TSH", "T4TOTAL", "FREET4", "T3FREE", "THYROIDAB" in the last 72 hours. Lipid Profile: No results for  input(s): "CHOL", "HDL", "LDLCALC", "TRIG", "CHOLHDL", "LDLDIRECT" in the last 72 hours. Anemia Panel: No results for input(s): "VITAMINB12", "FOLATE", "FERRITIN", "TIBC", "IRON", "RETICCTPCT" in the last 72 hours. Urine analysis:    Component Value Date/Time   COLORURINE YELLOW (A) 07/24/2022 1027   APPEARANCEUR CLEAR (A) 07/24/2022 1027   APPEARANCEUR Hazy 01/26/2014 2035   LABSPEC 1.006 07/24/2022 1027   LABSPEC 1.029 01/26/2014 2035   PHURINE 6.0 07/24/2022 1027   GLUCOSEU NEGATIVE 07/24/2022 1027   GLUCOSEU Negative 01/26/2014 2035   HGBUR NEGATIVE 07/24/2022 1027   BILIRUBINUR NEGATIVE 07/24/2022 1027   BILIRUBINUR small (A) 07/11/2021 1600   BILIRUBINUR Negative 01/26/2014 2035   KETONESUR NEGATIVE 07/24/2022 1027   PROTEINUR NEGATIVE 07/24/2022 1027   UROBILINOGEN 4.0 (A) 07/11/2021 1600   NITRITE  NEGATIVE 07/24/2022 1027   LEUKOCYTESUR TRACE (A) 07/24/2022 1027   LEUKOCYTESUR Negative 01/26/2014 2035   Sepsis Labs: Invalid input(s): "PROCALCITONIN", "LACTICIDVEN"  Microbiology: No results found for this or any previous visit (from the past 240 hour(s)).  Radiology Studies: No results found.    Courtney Swanson [facility] [place]  If 7PM-7AM, please contact night-coverage [email or web address] 07/28/2022, 12:52 PM

## 2022-07-29 ENCOUNTER — Inpatient Hospital Stay: Payer: Medicaid Other

## 2022-07-29 DIAGNOSIS — F1129 Opioid dependence with unspecified opioid-induced disorder: Secondary | ICD-10-CM

## 2022-07-29 LAB — CBC
HCT: 26.7 % — ABNORMAL LOW (ref 36.0–46.0)
Hemoglobin: 9.3 g/dL — ABNORMAL LOW (ref 12.0–15.0)
MCH: 34.7 pg — ABNORMAL HIGH (ref 26.0–34.0)
MCHC: 34.8 g/dL (ref 30.0–36.0)
MCV: 99.6 fL (ref 80.0–100.0)
Platelets: 243 10*3/uL (ref 150–400)
RBC: 2.68 MIL/uL — ABNORMAL LOW (ref 3.87–5.11)
RDW: 15.6 % — ABNORMAL HIGH (ref 11.5–15.5)
WBC: 10.3 10*3/uL (ref 4.0–10.5)
nRBC: 0 % (ref 0.0–0.2)

## 2022-07-29 LAB — RENAL FUNCTION PANEL
Albumin: 2.3 g/dL — ABNORMAL LOW (ref 3.5–5.0)
Anion gap: 5 (ref 5–15)
BUN: <5 mg/dL — ABNORMAL LOW (ref 6–20)
CO2: 27 mmol/L (ref 22–32)
Calcium: 7.8 mg/dL — ABNORMAL LOW (ref 8.9–10.3)
Chloride: 103 mmol/L (ref 98–111)
Creatinine, Ser: 0.43 mg/dL — ABNORMAL LOW (ref 0.44–1.00)
GFR, Estimated: >60 mL/min (ref >60)
Glucose, Bld: 101 mg/dL — ABNORMAL HIGH (ref 70–99)
Phosphorus: 2 mg/dL — ABNORMAL LOW (ref 2.5–4.6)
Potassium: 3.2 mmol/L — ABNORMAL LOW (ref 3.5–5.1)
Sodium: 135 mmol/L (ref 135–145)

## 2022-07-29 LAB — LIPASE, BLOOD: Lipase: 88 U/L — ABNORMAL HIGH (ref 11–51)

## 2022-07-29 LAB — MAGNESIUM: Magnesium: 2 mg/dL (ref 1.7–2.4)

## 2022-07-29 MED ORDER — OXYCODONE HCL 5 MG PO TABS: 5.0000 mg | ORAL_TABLET | Freq: Three times a day (TID) | ORAL | 0 refills | Status: AC | PRN

## 2022-07-29 MED ORDER — POTASSIUM PHOSPHATES 15 MMOLE/5ML IV SOLN
15.0000 mmol | Freq: Once | INTRAVENOUS | Status: AC
  Administered 2022-07-29: 15 mmol via INTRAVENOUS
  Filled 2022-07-29: qty 5

## 2022-07-29 MED ORDER — AMLODIPINE BESYLATE 5 MG PO TABS: 5.0000 mg | ORAL_TABLET | Freq: Every day | ORAL | 1 refills | Status: DC

## 2022-07-29 MED ORDER — VITAMIN B-1 100 MG PO TABS: 100.0000 mg | ORAL_TABLET | Freq: Every day | ORAL | 0 refills | Status: AC

## 2022-07-29 MED ORDER — FOLIC ACID 1 MG PO TABS: 1.0000 mg | ORAL_TABLET | Freq: Every day | ORAL | 2 refills | Status: AC

## 2022-07-29 MED ORDER — POTASSIUM CHLORIDE CRYS ER 20 MEQ PO TBCR
40.0000 meq | EXTENDED_RELEASE_TABLET | ORAL | Status: AC
  Administered 2022-07-29 (×2): 40 meq via ORAL
  Filled 2022-07-29 (×2): qty 2

## 2022-07-29 MED ORDER — NICOTINE 21 MG/24HR TD PT24: 21.0000 mg | MEDICATED_PATCH | Freq: Every day | TRANSDERMAL | 0 refills | Status: DC

## 2022-07-29 MED ORDER — ADULT MULTIVITAMIN W/MINERALS CH: 1.0000 | ORAL_TABLET | Freq: Every day | ORAL | 1 refills | Status: DC

## 2022-07-29 MED ORDER — SENNOSIDES-DOCUSATE SODIUM 8.6-50 MG PO TABS: 1.0000 | ORAL_TABLET | Freq: Two times a day (BID) | ORAL | 0 refills | Status: DC | PRN

## 2022-07-29 NOTE — Progress Notes (Signed)
Patient is being discharged home. Awaiting Potassium Phosphate infusion to complete. AVS given and explained to patient. She verbalized understanding. Meds reviewed with patient. Rx sent electronically to the pharmacy. Patient made aware. Patient stated her ride will get here around 8pm.

## 2022-07-29 NOTE — Progress Notes (Signed)
Patient c/o abdominal and suprapubic distension and soreness. She said the distension improved, but it is sore and wants MD to assess.  Dr. Cyndia Skeeters made aware. MD recommended ambulation and ordered a KUB.

## 2022-07-29 NOTE — Discharge Summary (Signed)
Physician Discharge Summary  Courtney Swanson [id number] DOB: 1995-07-30 DOA: 07/24/2022  [facility]P: [facility] date: 07/24/2022 Discharge date: 07/29/2022 Admitted From: Home Disposition: Home Recommendations for Outpatient Follow-up:  Follow up with PCP in 1 week Check CMP and CBC at follow-up Continue encouraging alcohol and smoking cessation Reassess blood pressure and adjust antihypertensive as appropriate Please follow up on the following pending results: None  Home Health: Not indicated. Equipment/Devices: Not indicated  Discharge Condition: Stable CODE STATUS: Full code  [place], [facility]. Schedule an appointment as soon as possible for a visit in 1 week(s).   Specialty: General Practice Contact information: [place] [address] [phone]                 Hospital course 27 year old F with PMH of polysubstance abuse including EtOH, tobacco and opiate) currently on Suboxone), mood disorder and HTN presenting with abdominal pain and admitted for acute alcoholic pancreatitis without complication.  Lipase elevated to 315.  EtOH level was 90.   Patient was managed conservatively with IV fluid, bowel rest and pain medications.  Eventually, symptoms resolved.  Lipase down to 80s.  She tolerated soft diet, and discharge.   Patient was consulted on alcohol and smoking cessation.  Provided with resources as well  See individual problem list below for more.   Problems addressed during this hospitalization Principal Problem:   Alcoholic pancreatitis Active Problems:   Alcohol abuse   Mood disorder ([place])   Hypertension   History of substance abuse ([place])   Tobacco dependence   Alcoholic pancreatitis: CT suggests acute pancreatitis without complication.  Lipase elevated to 315 and improved.  EtOH level 90.  Improved.  Tolerated soft diet. -Gave Rx for p.o.  oxycodone 5 mg every 8 hours as needed for severe pain, #9. -Patient to continue Suboxone afterward -Advised to continue soft bland diet and avoid greasy diet for the next 4 to 5 days. -Discontinued HCTZ   Alcohol abuse: Admits to high-volume EtOH. EtOH level 90 on presentation.  No major withdrawal symptoms. -Encourage alcohol cessation -Continue multivitamin, folic acid and thiamine   Tobacco dependence -Encouraged cessation.     History of substance abuse/opioid dependence on Suboxone -Continue on Suboxone   Hypertension: Improved. -Stop HCTZ on discharge given risk for pancreatitis. -Started on amlodipine this hospitalization   Mood disorder ([place]) -Continue Zoloft   Hypokalemia/hypomagnesemia/hypophosphatemia -Replenished prior to discharge. -Discontinued HCTZ   Normocytic anemia: Relatively stable. -Recheck CBC at follow-up.  Leukocytosis/bandemia: Resolved.           Vital signs Vitals:   07/28/22 1958 07/29/22 0452 07/29/22 0820 07/29/22 1708  BP: 124/78 123/86 126/73 129/87  Pulse: 96 85 89 86  Temp: 98.1 F (36.7 C) 98.1 F (36.7 C) 98.7 F (37.1 C) 98 F (36.7 C)  Resp: '18 18 20 16  '$ Height:      Weight:      SpO2: 98% 94% 96% 98%  TempSrc: Oral Oral    BMI (Calculated):         Discharge exam  GENERAL: No apparent distress.  Nontoxic. HEENT: MMM.  Vision and hearing grossly intact.  NECK: Supple.  No apparent JVD.  RESP:  No IWOB.  Fair aeration bilaterally. CVS:  RRR. Heart sounds normal.  ABD/GI/GU: BS+. Abd soft.  Mild diffuse tenderness but distractible. MSK/EXT:  Moves extremities. No apparent deformity. No edema.  SKIN: no apparent skin  lesion or wound NEURO: Awake and alert. Oriented appropriately.  No apparent focal neuro deficit. PSYCH: Calm. Normal affect.   Discharge Instructions Discharge Instructions     Call MD for:  persistant nausea and vomiting   Complete by: As directed    Call MD for:  severe uncontrolled pain    Complete by: As directed    Call MD for:  temperature >100.4   Complete by: As directed    Diet - low sodium heart healthy   Complete by: As directed    Soft bland diet for the next 4 to 5 days.   Discharge instructions   Complete by: As directed    It has been a pleasure taking care of you!  You were hospitalized due to acute pancreatitis likely from alcohol.  It is very important that you stop drinking alcohol and smoking cigarettes.  Continue soft bland diet over the next 4 to 5 days.  Avoid any greasy food.  We have stopped your hydrochlorothiazide which could potentially increase your risk of pancreatitis.  We have started you on amlodipine. Please review your new medication list and the directions on your medications before you take them.  Follow-up with your primary care doctor in 1 to 2 weeks or sooner if needed.   Take care,   Increase activity slowly   Complete by: As directed       Allergies as of 07/29/2022       Reactions   Amoxicillin Other (See Comments)   Mother told me I was allergic   Tylenol [acetaminophen] Nausea And Vomiting        Medication List     STOP taking these medications    hydrochlorothiazide 12.5 MG capsule Commonly known as: MICROZIDE       TAKE these medications    amLODipine 5 MG tablet Commonly known as: NORVASC Take 1 tablet (5 mg total) by mouth daily.   buprenorphine-naloxone 8-2 mg Subl SL tablet Commonly known as: SUBOXONE Place 1 tablet under the tongue 3 (three) times daily.   EPINEPHrine 0.3 mg/0.3 mL Soaj injection Commonly known as: EPI-PEN Inject 0.3 mg into the muscle as needed for anaphylaxis.   folic acid 1 MG tablet Commonly known as: FOLVITE Take 1 tablet (1 mg total) by mouth daily.   multivitamin with minerals Tabs tablet Take 1 tablet by mouth daily.   nicotine 21 mg/24hr patch Commonly known as: NICODERM CQ - dosed in mg/24 hours Place 1 patch (21 mg total) onto the skin daily.   omeprazole 20  MG capsule Commonly known as: PRILOSEC Take 20 mg by mouth daily.   oxyCODONE 5 MG immediate release tablet Commonly known as: Oxy IR/ROXICODONE Take 1 tablet (5 mg total) by mouth every 8 (eight) hours as needed for up to 3 days for severe pain.   senna-docusate 8.6-50 MG tablet Commonly known as: Senokot-S Take 1 tablet by mouth 2 (two) times daily between meals as needed for moderate constipation.   sertraline 50 MG tablet Commonly known as: ZOLOFT Take 50 mg by mouth daily.   thiamine 100 MG tablet Commonly known as: Vitamin B-1 Take 1 tablet (100 mg total) by mouth daily.        Consultations: None  Procedures/Studies:   DG Abd Portable 1V  Result Date: 07/29/2022 CLINICAL DATA:  [id number] Abdominal distension [id number] EXAM: PORTABLE ABDOMEN - 1 VIEW COMPARISON:  07/26/2022 FINDINGS: The bowel gas pattern is normal. No radio-opaque calculi or other significant radiographic abnormality are seen.  IMPRESSION: Negative. Electronically Signed   By: Davina Poke D.O.   On: 07/29/2022 13:30   CT ABDOMEN PELVIS W CONTRAST  Result Date: 07/26/2022 CLINICAL DATA:  Pancreatitis. EXAM: CT ABDOMEN AND PELVIS WITH CONTRAST TECHNIQUE: Multidetector CT imaging of the abdomen and pelvis was performed using the standard protocol following bolus administration of intravenous contrast. RADIATION DOSE REDUCTION: This exam was performed according to the departmental dose-optimization program which includes automated exposure control, adjustment of the mA and/or kV according to patient size and/or use of iterative reconstruction technique. CONTRAST:  123m OMNIPAQUE IOHEXOL 300 MG/ML  SOLN COMPARISON:  CT abdomen pelvis dated 04/16/2018. FINDINGS: Lower chest: Partially visualized moderate bilateral pleural effusions. There is partial compressive atelectasis of the lower lobes versus pneumonia. No intra-abdominal free air.  Small ascites. Hepatobiliary: The liver is unremarkable. No biliary dilatation.  The gallbladder is unremarkable. Pancreas: There is inflammatory changes of the pancreas consistent with acute pancreatitis. No drainable fluid collection/abscess or pseudocyst at this time. Spleen: Normal in size without focal abnormality. Adrenals/Urinary Tract: The adrenal glands unremarkable. Six the kidneys, visualized ureters and urinary bladder appear unremarkable. Stomach/Bowel: There is diffuse thickened appearance of the colon, likely related to ascites. Colitis is less likely. Clinical correlation is recommended. There is no bowel obstruction. Six the appendix is normal. Vascular/Lymphatic: The abdominal aorta and IVC are unremarkable. The SMV, splenic vein, and main portal vein are patent. No portal venous gas. There is no adenopathy. Reproductive: The uterus and ovaries are grossly unremarkable. A birth control vaginal ring noted. Other: Mild edema of the anterior abdominal wall. Musculoskeletal: No acute or significant osseous findings. IMPRESSION: 1. Acute pancreatitis. No drainable fluid collection/abscess or pseudocyst at this time. 2. Partially visualized moderate bilateral pleural effusions with partial compressive atelectasis of the lower lobes versus pneumonia. 3. Small ascites. 4. Diffuse thickened appearance of the colon, likely related to ascites. Colitis is less likely. No bowel obstruction. Normal appendix. Electronically Signed   By: AAnner CreteM.D.   On: 07/26/2022 16:12       The results of significant diagnostics from this hospitalization (including imaging, microbiology, ancillary and laboratory) are listed below for reference.     Microbiology: No results found for this or any previous visit (from the past 240 hour(s)).   Labs:  CBC: Recent Labs  Lab 07/24/22 0931 07/24/22 1409 07/25/22 0348 07/26/22 0450 07/27/22 1605 07/28/22 0404 07/29/22 0529  WBC 6.8   < > 14.0* 15.8* 16.0* 13.7* 10.3  NEUTROABS 2.4  --   --  13.5* 13.2* 10.6*  --   HGB 10.6*   < >  12.8 10.8* 10.7* 10.1* 9.3*  HCT 31.6*   < > 38.5 32.0* 30.7* 29.0* 26.7*  MCV 99.7   < > 101.9* 100.3* 98.4 98.3 99.6  PLT 175   < > 154 152 174 187 243   < > = values in this interval not displayed.   BMP &GFR Recent Labs  Lab 07/25/22 0348 07/26/22 0450 07/27/22 1605 07/28/22 0403 07/28/22 0404 07/29/22 0529  NA 136 136 133*  --  136 135  K 2.9* 2.8* 2.4*  --  2.7* 3.2*  CL 101 102 100  --  104 103  CO2 25 25 20*  --  26 27  GLUCOSE 137* 91 85  --  118* 101*  BUN <5* <5* <5*  --  <5* <5*  CREATININE 0.50 0.44 0.48  --  0.43* 0.43*  CALCIUM 8.1* 7.8* 7.6*  --  8.0* 7.8*  MG  --   --   --  1.4*  --  2.0  PHOS  --   --   --   --   --  2.0*   Estimated Creatinine Clearance: 111.6 mL/min (A) (by C-G formula based on SCr of 0.43 mg/dL (L)). Liver & Pancreas: Recent Labs  Lab 07/24/22 0931 07/25/22 0348 07/26/22 0450 07/28/22 0404 07/29/22 0529  AST 27 24 13* 14*  --   ALT '9 11 8 8  '$ --   ALKPHOS 31* 31* 34* 47  --   BILITOT 1.0 0.8 0.8 0.7  --   PROT 6.9 6.5 5.7* 5.8*  --   ALBUMIN 3.1* 3.0* 2.5* 2.4* 2.3*   Recent Labs  Lab 07/24/22 0931 07/28/22 0403 07/29/22 0529  LIPASE 315* 82* 88*   No results for input(s): "AMMONIA" in the last 168 hours. Diabetic: No results for input(s): "HGBA1C" in the last 72 hours. No results for input(s): "GLUCAP" in the last 168 hours. Cardiac Enzymes: No results for input(s): "CKTOTAL", "CKMB", "CKMBINDEX", "TROPONINI" in the last 168 hours. No results for input(s): "PROBNP" in the last 8760 hours. Coagulation Profile: No results for input(s): "INR", "PROTIME" in the last 168 hours. Thyroid Function Tests: No results for input(s): "TSH", "T4TOTAL", "FREET4", "T3FREE", "THYROIDAB" in the last 72 hours. Lipid Profile: No results for input(s): "CHOL", "HDL", "LDLCALC", "TRIG", "CHOLHDL", "LDLDIRECT" in the last 72 hours. Anemia Panel: No results for input(s): "VITAMINB12", "FOLATE", "FERRITIN", "TIBC", "IRON", "RETICCTPCT" in the  last 72 hours. Urine analysis:    Component Value Date/Time   COLORURINE YELLOW (A) 07/24/2022 1027   APPEARANCEUR CLEAR (A) 07/24/2022 1027   APPEARANCEUR Hazy 01/26/2014 2035   LABSPEC 1.006 07/24/2022 1027   LABSPEC 1.029 01/26/2014 2035   PHURINE 6.0 07/24/2022 1027   GLUCOSEU NEGATIVE 07/24/2022 1027   GLUCOSEU Negative 01/26/2014 2035   HGBUR NEGATIVE 07/24/2022 1027   BILIRUBINUR NEGATIVE 07/24/2022 1027   BILIRUBINUR small (A) 07/11/2021 1600   BILIRUBINUR Negative 01/26/2014 2035   KETONESUR NEGATIVE 07/24/2022 1027   PROTEINUR NEGATIVE 07/24/2022 1027   UROBILINOGEN 4.0 (A) 07/11/2021 1600   NITRITE NEGATIVE 07/24/2022 1027   LEUKOCYTESUR TRACE (A) 07/24/2022 1027   LEUKOCYTESUR Negative 01/26/2014 2035   Sepsis Labs: Invalid input(s): "PROCALCITONIN", "LACTICIDVEN"   SIGNED:  Mercy Riding, MD  [facility] 07/29/2022, 8:39 PM

## 2022-10-01 ENCOUNTER — Emergency Department: Payer: Medicaid Other

## 2022-10-01 ENCOUNTER — Encounter: Payer: Self-pay | Admitting: Emergency Medicine

## 2022-10-01 ENCOUNTER — Other Ambulatory Visit: Payer: Self-pay

## 2022-10-01 ENCOUNTER — Inpatient Hospital Stay
Admission: EM | Admit: 2022-10-01 | Discharge: 2022-10-07 | DRG: 439 | Disposition: A | Discharge status: Home / Self Care | Payer: Medicaid Other | Attending: Osteopathic Medicine | Admitting: Osteopathic Medicine

## 2022-10-01 DIAGNOSIS — Z8249 Family history of ischemic heart disease and other diseases of the circulatory system: Secondary | ICD-10-CM

## 2022-10-01 DIAGNOSIS — I10 Essential (primary) hypertension: Secondary | ICD-10-CM | POA: Diagnosis not present

## 2022-10-01 DIAGNOSIS — Z888 Allergy status to other drugs, medicaments and biological substances status: Secondary | ICD-10-CM

## 2022-10-01 DIAGNOSIS — Z886 Allergy status to analgesic agent status: Secondary | ICD-10-CM

## 2022-10-01 DIAGNOSIS — F1911 Other psychoactive substance abuse, in remission: Secondary | ICD-10-CM

## 2022-10-01 DIAGNOSIS — K852 Alcohol induced acute pancreatitis without necrosis or infection: Secondary | ICD-10-CM | POA: Diagnosis not present

## 2022-10-01 DIAGNOSIS — B353 Tinea pedis: Secondary | ICD-10-CM | POA: Diagnosis present

## 2022-10-01 DIAGNOSIS — F1010 Alcohol abuse, uncomplicated: Secondary | ICD-10-CM

## 2022-10-01 DIAGNOSIS — Z733 Stress, not elsewhere classified: Secondary | ICD-10-CM

## 2022-10-01 DIAGNOSIS — F17210 Nicotine dependence, cigarettes, uncomplicated: Secondary | ICD-10-CM | POA: Diagnosis present

## 2022-10-01 DIAGNOSIS — F1120 Opioid dependence, uncomplicated: Secondary | ICD-10-CM | POA: Diagnosis present

## 2022-10-01 DIAGNOSIS — Z818 Family history of other mental and behavioral disorders: Secondary | ICD-10-CM

## 2022-10-01 DIAGNOSIS — J45909 Unspecified asthma, uncomplicated: Secondary | ICD-10-CM | POA: Diagnosis present

## 2022-10-01 DIAGNOSIS — Z880 Allergy status to penicillin: Secondary | ICD-10-CM

## 2022-10-01 DIAGNOSIS — Z833 Family history of diabetes mellitus: Secondary | ICD-10-CM

## 2022-10-01 DIAGNOSIS — Z79899 Other long term (current) drug therapy: Secondary | ICD-10-CM

## 2022-10-01 DIAGNOSIS — Z881 Allergy status to other antibiotic agents status: Secondary | ICD-10-CM

## 2022-10-01 DIAGNOSIS — Z634 Disappearance and death of family member: Secondary | ICD-10-CM

## 2022-10-01 HISTORY — DX: Acute pancreatitis without necrosis or infection, unspecified: K85.90

## 2022-10-01 LAB — URINALYSIS, ROUTINE W REFLEX MICROSCOPIC
Bilirubin Urine: NEGATIVE
Glucose, UA: NEGATIVE mg/dL
Ketones, ur: NEGATIVE mg/dL
Leukocytes,Ua: NEGATIVE
Nitrite: NEGATIVE
Protein, ur: NEGATIVE mg/dL
Specific Gravity, Urine: 1.025 (ref 1.005–1.030)
pH: 6 (ref 5.0–8.0)

## 2022-10-01 LAB — COMPREHENSIVE METABOLIC PANEL
ALT: 10 U/L (ref 0–44)
AST: 23 U/L (ref 15–41)
Albumin: 4 g/dL (ref 3.5–5.0)
Alkaline Phosphatase: 42 U/L (ref 38–126)
Anion gap: 10 (ref 5–15)
BUN: 6 mg/dL (ref 6–20)
CO2: 22 mmol/L (ref 22–32)
Calcium: 9 mg/dL (ref 8.9–10.3)
Chloride: 104 mmol/L (ref 98–111)
Creatinine, Ser: 0.55 mg/dL (ref 0.44–1.00)
GFR, Estimated: >60 mL/min (ref >60)
Glucose, Bld: 159 mg/dL — ABNORMAL HIGH (ref 70–99)
Potassium: 3.6 mmol/L (ref 3.5–5.1)
Sodium: 136 mmol/L (ref 135–145)
Total Bilirubin: 0.6 mg/dL (ref 0.3–1.2)
Total Protein: 7.9 g/dL (ref 6.5–8.1)

## 2022-10-01 LAB — CBC
HCT: 38.3 % (ref 36.0–46.0)
Hemoglobin: 12.5 g/dL (ref 12.0–15.0)
MCH: 32 pg (ref 26.0–34.0)
MCHC: 32.6 g/dL (ref 30.0–36.0)
MCV: 98 fL (ref 80.0–100.0)
Platelets: 307 10*3/uL (ref 150–400)
RBC: 3.91 MIL/uL (ref 3.87–5.11)
RDW: 15 % (ref 11.5–15.5)
WBC: 12.1 10*3/uL — ABNORMAL HIGH (ref 4.0–10.5)
nRBC: 0 % (ref 0.0–0.2)

## 2022-10-01 LAB — LIPASE, BLOOD: Lipase: 194 U/L — ABNORMAL HIGH (ref 11–51)

## 2022-10-01 LAB — POC URINE PREG, ED: Preg Test, Ur: NEGATIVE

## 2022-10-01 MED ORDER — SODIUM CHLORIDE 0.9% FLUSH
3.0000 mL | Freq: Two times a day (BID) | INTRAVENOUS | Status: DC
  Administered 2022-10-01 – 2022-10-06 (×10): 3 mL via INTRAVENOUS

## 2022-10-01 MED ORDER — SERTRALINE HCL 50 MG PO TABS
50.0000 mg | ORAL_TABLET | Freq: Every day | ORAL | Status: DC
  Administered 2022-10-02 – 2022-10-07 (×6): 50 mg via ORAL
  Filled 2022-10-01 (×6): qty 1

## 2022-10-01 MED ORDER — ONDANSETRON HCL 4 MG/2ML IJ SOLN
4.0000 mg | Freq: Once | INTRAMUSCULAR | Status: AC | PRN
  Administered 2022-10-01: 4 mg via INTRAVENOUS
  Filled 2022-10-01: qty 2

## 2022-10-01 MED ORDER — FOLIC ACID 1 MG PO TABS
1.0000 mg | ORAL_TABLET | Freq: Every day | ORAL | Status: DC
  Administered 2022-10-02 – 2022-10-07 (×6): 1 mg via ORAL
  Filled 2022-10-01 (×7): qty 1

## 2022-10-01 MED ORDER — THIAMINE MONONITRATE 100 MG PO TABS
100.0000 mg | ORAL_TABLET | Freq: Every day | ORAL | Status: DC
  Administered 2022-10-02 – 2022-10-07 (×5): 100 mg via ORAL
  Filled 2022-10-01 (×6): qty 1

## 2022-10-01 MED ORDER — THIAMINE HCL 100 MG/ML IJ SOLN
100.0000 mg | Freq: Every day | INTRAMUSCULAR | Status: DC
  Administered 2022-10-04: 100 mg via INTRAVENOUS
  Filled 2022-10-01 (×3): qty 2

## 2022-10-01 MED ORDER — LORAZEPAM 2 MG/ML IJ SOLN
1.0000 mg | Freq: Once | INTRAMUSCULAR | Status: AC
  Administered 2022-10-01: 1 mg via INTRAVENOUS
  Filled 2022-10-01: qty 1

## 2022-10-01 MED ORDER — IOHEXOL 300 MG/ML  SOLN
100.0000 mL | Freq: Once | INTRAMUSCULAR | Status: AC | PRN
  Administered 2022-10-01: 100 mL via INTRAVENOUS

## 2022-10-01 MED ORDER — HYDROMORPHONE HCL 1 MG/ML IJ SOLN
1.0000 mg | Freq: Once | INTRAMUSCULAR | Status: AC
  Administered 2022-10-01: 1 mg via INTRAVENOUS
  Filled 2022-10-01: qty 1

## 2022-10-01 MED ORDER — LACTATED RINGERS IV SOLN: INTRAVENOUS | Status: AC

## 2022-10-01 MED ORDER — ACETAMINOPHEN 325 MG PO TABS
650.0000 mg | ORAL_TABLET | Freq: Four times a day (QID) | ORAL | Status: DC | PRN
  Administered 2022-10-02 – 2022-10-03 (×4): 650 mg via ORAL
  Filled 2022-10-01 (×4): qty 2

## 2022-10-01 MED ORDER — BUPRENORPHINE HCL-NALOXONE HCL 8-2 MG SL SUBL
1.0000 | SUBLINGUAL_TABLET | Freq: Three times a day (TID) | SUBLINGUAL | Status: DC
  Administered 2022-10-01 – 2022-10-07 (×16): 1 via SUBLINGUAL
  Filled 2022-10-01 (×20): qty 1

## 2022-10-01 MED ORDER — HYDROMORPHONE HCL 1 MG/ML IJ SOLN
1.0000 mg | INTRAMUSCULAR | Status: DC | PRN
  Administered 2022-10-01 – 2022-10-06 (×38): 1 mg via INTRAVENOUS
  Filled 2022-10-01 (×40): qty 1

## 2022-10-01 MED ORDER — ONDANSETRON HCL 4 MG/2ML IJ SOLN
4.0000 mg | Freq: Four times a day (QID) | INTRAMUSCULAR | Status: DC | PRN
  Administered 2022-10-01: 4 mg via INTRAVENOUS
  Filled 2022-10-01 (×2): qty 2

## 2022-10-01 MED ORDER — ADULT MULTIVITAMIN W/MINERALS CH
1.0000 | ORAL_TABLET | Freq: Every day | ORAL | Status: DC
  Administered 2022-10-02 – 2022-10-07 (×6): 1 via ORAL
  Filled 2022-10-01 (×7): qty 1

## 2022-10-01 MED ORDER — ONDANSETRON HCL 4 MG/2ML IJ SOLN
4.0000 mg | Freq: Once | INTRAMUSCULAR | Status: AC
  Administered 2022-10-01: 4 mg via INTRAVENOUS
  Filled 2022-10-01: qty 2

## 2022-10-01 MED ORDER — LACTATED RINGERS IV BOLUS
1000.0000 mL | Freq: Once | INTRAVENOUS | Status: AC
  Administered 2022-10-01: 1000 mL via INTRAVENOUS

## 2022-10-01 MED ORDER — AMLODIPINE BESYLATE 5 MG PO TABS
5.0000 mg | ORAL_TABLET | Freq: Every day | ORAL | Status: DC
  Administered 2022-10-02 – 2022-10-07 (×6): 5 mg via ORAL
  Filled 2022-10-01 (×7): qty 1

## 2022-10-01 MED ORDER — ACETAMINOPHEN 650 MG RE SUPP: 650.0000 mg | Freq: Four times a day (QID) | RECTAL | Status: DC | PRN

## 2022-10-01 MED ORDER — ONDANSETRON HCL 4 MG PO TABS: 4.0000 mg | ORAL_TABLET | Freq: Four times a day (QID) | ORAL | Status: DC | PRN

## 2022-10-01 MED ORDER — ENOXAPARIN SODIUM 40 MG/0.4ML IJ SOSY
40.0000 mg | PREFILLED_SYRINGE | INTRAMUSCULAR | Status: DC
  Administered 2022-10-01 – 2022-10-06 (×5): 40 mg via SUBCUTANEOUS
  Filled 2022-10-01 (×4): qty 0.4

## 2022-10-01 NOTE — Assessment & Plan Note (Signed)
-  Continue home amlodipine 

## 2022-10-01 NOTE — Progress Notes (Signed)
Pt received in bed. Pt c/o of 8/10 abd pain. Pt given IV pain meds. CIWA completed 4. Pt skin is clammy. Pt is requesting nicotine patch. IVF infusing. Family is at bedside. NAD noted at this time. RN will continue to monitor. Pt is NPO with ice chips and small sips for meds.   Pt restless and states she is having a hard time getting comfortable.  Pt education on pain meds, pain control, and bowel rest.   No NVD for this shift.

## 2022-10-01 NOTE — ED Provider Notes (Signed)
[facility] Provider Note    Event Date/Time   First MD Initiated Contact with Patient 10/01/22 1058     (approximate)   History   Abdominal Pain   HPI  Courtney Swanson is a 27 y.o. female with past medical history of EtOH use, hypertension and pancreatitis who presents with abdominal pain.  Symptoms started this morning.  Patient endorses is a sharp stabbing pain in the abdomen that is diffuse and constant.  Has also had 6 episodes of nonbloody nonbilious emesis.  Denies constipation or diarrhea.  Denies urinary symptoms.  Has had chills.  Says that since her prior episode of pancreatitis her drinking has slowed down but she does drink about every other day.      Past Medical History:  Diagnosis Date   Asthma    Pancreatitis    Substance use disorder    drug of choice oxycodone, last use 2021    Patient Active Problem List   Diagnosis Date Noted   Tobacco dependence 07/25/2022   Alcoholic pancreatitis 07/24/2022   Alcohol abuse 07/24/2022   Mood disorder (HCC) 07/24/2022   Hypertension 07/24/2022   History of substance abuse (HCC) 07/24/2022   Vaginal discharge in pregnancy in second trimester 12/28/2021   Supervision of high risk pregnancy in second trimester 08/06/2021     Physical Exam  Triage Vital Signs: ED Triage Vitals  Enc Vitals Group     BP 10/01/22 1053 (!) 140/108     Pulse Rate 10/01/22 1053 98     Resp 10/01/22 1053 20     Temp 10/01/22 1053 98.5 F (36.9 C)     Temp Source 10/01/22 1053 Oral     SpO2 10/01/22 1053 98 %     Weight 10/01/22 1050 165 lb 5.5 oz (75 kg)     Height 10/01/22 1050 5\' 7"  (1.702 m)     Head Circumference --      Peak Flow --      Pain Score 10/01/22 1050 10     Pain Loc --      Pain Edu? --      Excl. in GC? --     Most recent vital signs: Vitals:   10/01/22 1053  BP: (!) 140/108  Pulse: 98  Resp: 20  Temp: 98.5 F (36.9 C)  SpO2: 98%     General: Awake, patient is writhing  around on the stretcher looks uncomfortable CV:  Good peripheral perfusion.  Resp:  Normal effort.  Abd:  No distention.  Abdomen is diffusely tender to light touch with voluntary guarding Neuro:             Awake, Alert, Oriented x 3  Other:     ED Results / Procedures / Treatments  Labs (all labs ordered are listed, but only abnormal results are displayed) Labs Reviewed  LIPASE, BLOOD - Abnormal; Notable for the following components:      Result Value   Lipase 194 (*)    All other components within normal limits  COMPREHENSIVE METABOLIC PANEL - Abnormal; Notable for the following components:   Glucose, Bld 159 (*)    All other components within normal limits  CBC - Abnormal; Notable for the following components:   WBC 12.1 (*)    All other components within normal limits  URINALYSIS, ROUTINE W REFLEX MICROSCOPIC - Abnormal; Notable for the following components:   Color, Urine YELLOW (*)    APPearance HAZY (*)    Hgb  urine dipstick MODERATE (*)    Bacteria, UA RARE (*)    All other components within normal limits  POC URINE PREG, ED     EKG     RADIOLOGY I reviewed interpreted the CT of the abdomen pelvis which is consistent with acute pancreatitis   PROCEDURES:  Critical Care performed: No  Procedures  The patient is on the cardiac monitor to evaluate for evidence of arrhythmia and/or significant heart rate changes.   MEDICATIONS ORDERED IN ED: Medications  ondansetron (ZOFRAN) injection 4 mg (4 mg Intravenous Given 10/01/22 1059)  HYDROmorphone (DILAUDID) injection 1 mg (1 mg Intravenous Given 10/01/22 1124)  ondansetron (ZOFRAN) injection 4 mg (4 mg Intravenous Given 10/01/22 1124)  lactated ringers bolus 1,000 mL (1,000 mLs Intravenous New Bag/Given 10/01/22 1125)  iohexol (OMNIPAQUE) 300 MG/ML solution 100 mL (100 mLs Intravenous Contrast Given 10/01/22 1159)  HYDROmorphone (DILAUDID) injection 1 mg (1 mg Intravenous Given 10/01/22 1225)     IMPRESSION /  MDM / ASSESSMENT AND PLAN / ED COURSE  I reviewed the triage vital signs and the nursing notes.                              Patient's presentation is most consistent with acute complicated illness / injury requiring diagnostic workup.  Differential diagnosis includes, but is not limited to, pancreatitis, hepatitis, gastritis, perforation, bowel obstruction  The patient is a 27 year old female with history of alcohol use disorder who presents with abdominal pain and vomiting.  Feels similar to prior episode of pancreatitis.  Patient hypertensive on arrival but vitals are otherwise reassuring.  She looks very uncomfortable and is writhing around in the stretcher when I examine her.  She is tender diffusely.  And suspicious for recurrent episode of pancreatitis.  Labs are currently pending.  Will give a bolus of fluids Zofran Dilaudid.  Will get a CT of the abdomen pelvis given the degree of her pain.   Patient's labs are notable for mild leukocytosis to 12 and elevated lipase at 194.  CT is also consistent with acute pancreatitis without abscess or necrosis.  Patient has required multiple doses of IV opiates here with minimal improvement in pain.  She require admission for pain control and fluids.      FINAL CLINICAL IMPRESSION(S) / ED DIAGNOSES   Final diagnoses:  Alcohol-induced acute pancreatitis without infection or necrosis     Rx / DC Orders   ED Discharge Orders     None        Note:  This document was prepared using Dragon voice recognition software and may include unintentional dictation errors.   Georga Hacking, MD 10/01/22 1254

## 2022-10-01 NOTE — ED Triage Notes (Signed)
Pt in via EMS from a car in front of a house with c/o lower stabbing abd pain since this am. Pt has vomited x's 2. Pt was seen here on 3/3 an was admitted for pancreatitis and this feels the same. 150/90, 100%, HR 73

## 2022-10-01 NOTE — Assessment & Plan Note (Addendum)
Patient is presenting with acute pancreatitis without evidence of pseudocyst or infection in the setting of increased alcohol use over the last 1 week after stressors at home.  Previous history of alcohol induced pancreatitis.  - Dilaudid 1 g every 2 hours as needed - IV fluids 150 cc/h - Zofran as needed for nausea - CMP daily - Continuous pulse oximetry while receiving high-dose opioids

## 2022-10-01 NOTE — Assessment & Plan Note (Signed)
-   Continue home Suboxone 

## 2022-10-01 NOTE — ED Triage Notes (Signed)
Pt crying and moaning in triage, reports abd pain that is severe and NV. Pt reports hx of pancreatitis and states this feels the same.
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
## 2022-10-01 NOTE — ED Notes (Signed)
Patient declined pill medications due to fear of vomiting them back up

## 2022-10-02 DIAGNOSIS — Z79899 Other long term (current) drug therapy: Secondary | ICD-10-CM | POA: Diagnosis not present

## 2022-10-02 DIAGNOSIS — F1911 Other psychoactive substance abuse, in remission: Secondary | ICD-10-CM | POA: Diagnosis not present

## 2022-10-02 DIAGNOSIS — B353 Tinea pedis: Secondary | ICD-10-CM | POA: Diagnosis present

## 2022-10-02 DIAGNOSIS — F1120 Opioid dependence, uncomplicated: Secondary | ICD-10-CM | POA: Diagnosis present

## 2022-10-02 DIAGNOSIS — K852 Alcohol induced acute pancreatitis without necrosis or infection: Secondary | ICD-10-CM | POA: Diagnosis not present

## 2022-10-02 DIAGNOSIS — Z8249 Family history of ischemic heart disease and other diseases of the circulatory system: Secondary | ICD-10-CM | POA: Diagnosis not present

## 2022-10-02 DIAGNOSIS — Z818 Family history of other mental and behavioral disorders: Secondary | ICD-10-CM | POA: Diagnosis not present

## 2022-10-02 DIAGNOSIS — Z886 Allergy status to analgesic agent status: Secondary | ICD-10-CM | POA: Diagnosis not present

## 2022-10-02 DIAGNOSIS — Z888 Allergy status to other drugs, medicaments and biological substances status: Secondary | ICD-10-CM | POA: Diagnosis not present

## 2022-10-02 DIAGNOSIS — Z733 Stress, not elsewhere classified: Secondary | ICD-10-CM | POA: Diagnosis not present

## 2022-10-02 DIAGNOSIS — Z880 Allergy status to penicillin: Secondary | ICD-10-CM | POA: Diagnosis not present

## 2022-10-02 DIAGNOSIS — F1010 Alcohol abuse, uncomplicated: Secondary | ICD-10-CM | POA: Diagnosis present

## 2022-10-02 DIAGNOSIS — Z634 Disappearance and death of family member: Secondary | ICD-10-CM | POA: Diagnosis not present

## 2022-10-02 DIAGNOSIS — I10 Essential (primary) hypertension: Secondary | ICD-10-CM | POA: Diagnosis present

## 2022-10-02 DIAGNOSIS — Z881 Allergy status to other antibiotic agents status: Secondary | ICD-10-CM | POA: Diagnosis not present

## 2022-10-02 DIAGNOSIS — F17210 Nicotine dependence, cigarettes, uncomplicated: Secondary | ICD-10-CM | POA: Diagnosis present

## 2022-10-02 DIAGNOSIS — J45909 Unspecified asthma, uncomplicated: Secondary | ICD-10-CM | POA: Diagnosis present

## 2022-10-02 DIAGNOSIS — Z833 Family history of diabetes mellitus: Secondary | ICD-10-CM | POA: Diagnosis not present

## 2022-10-02 LAB — CBC
HCT: 37.9 % (ref 36.0–46.0)
Hemoglobin: 12.7 g/dL (ref 12.0–15.0)
MCH: 32.2 pg (ref 26.0–34.0)
MCHC: 33.5 g/dL (ref 30.0–36.0)
MCV: 96.2 fL (ref 80.0–100.0)
Platelets: 284 10*3/uL (ref 150–400)
RBC: 3.94 MIL/uL (ref 3.87–5.11)
RDW: 14.4 % (ref 11.5–15.5)
WBC: 14.9 10*3/uL — ABNORMAL HIGH (ref 4.0–10.5)
nRBC: 0 % (ref 0.0–0.2)

## 2022-10-02 LAB — COMPREHENSIVE METABOLIC PANEL
ALT: 8 U/L (ref 0–44)
AST: 15 U/L (ref 15–41)
Albumin: 3.4 g/dL — ABNORMAL LOW (ref 3.5–5.0)
Alkaline Phosphatase: 41 U/L (ref 38–126)
Anion gap: 8 (ref 5–15)
BUN: <5 mg/dL — ABNORMAL LOW (ref 6–20)
CO2: 25 mmol/L (ref 22–32)
Calcium: 8.6 mg/dL — ABNORMAL LOW (ref 8.9–10.3)
Chloride: 101 mmol/L (ref 98–111)
Creatinine, Ser: 0.52 mg/dL (ref 0.44–1.00)
GFR, Estimated: >60 mL/min (ref >60)
Glucose, Bld: 131 mg/dL — ABNORMAL HIGH (ref 70–99)
Potassium: 3.1 mmol/L — ABNORMAL LOW (ref 3.5–5.1)
Sodium: 134 mmol/L — ABNORMAL LOW (ref 135–145)
Total Bilirubin: 0.6 mg/dL (ref 0.3–1.2)
Total Protein: 6.9 g/dL (ref 6.5–8.1)

## 2022-10-02 MED ORDER — NICOTINE 21 MG/24HR TD PT24
21.0000 mg | MEDICATED_PATCH | Freq: Every day | TRANSDERMAL | Status: DC
  Administered 2022-10-02 – 2022-10-07 (×6): 21 mg via TRANSDERMAL
  Filled 2022-10-02 (×6): qty 1

## 2022-10-02 MED ORDER — POTASSIUM CHLORIDE CRYS ER 20 MEQ PO TBCR
40.0000 meq | EXTENDED_RELEASE_TABLET | ORAL | Status: AC
  Administered 2022-10-02: 40 meq via ORAL
  Filled 2022-10-02 (×3): qty 2
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
## 2022-10-03 DIAGNOSIS — K852 Alcohol induced acute pancreatitis without necrosis or infection: Secondary | ICD-10-CM | POA: Diagnosis not present

## 2022-10-03 LAB — CBC WITH DIFFERENTIAL/PLATELET
Abs Immature Granulocytes: 0.05 10*3/uL (ref 0.00–0.07)
Basophils Absolute: 0 10*3/uL (ref 0.0–0.1)
Basophils Relative: 0 %
Eosinophils Absolute: 0.1 10*3/uL (ref 0.0–0.5)
Eosinophils Relative: 1 %
HCT: 35.4 % — ABNORMAL LOW (ref 36.0–46.0)
Hemoglobin: 11.6 g/dL — ABNORMAL LOW (ref 12.0–15.0)
Immature Granulocytes: 0 %
Lymphocytes Relative: 20 %
Lymphs Abs: 2.4 10*3/uL (ref 0.7–4.0)
MCH: 32 pg (ref 26.0–34.0)
MCHC: 32.8 g/dL (ref 30.0–36.0)
MCV: 97.8 fL (ref 80.0–100.0)
Monocytes Absolute: 0.7 10*3/uL (ref 0.1–1.0)
Monocytes Relative: 6 %
Neutro Abs: 8.8 10*3/uL — ABNORMAL HIGH (ref 1.7–7.7)
Neutrophils Relative %: 73 %
Platelets: 251 10*3/uL (ref 150–400)
RBC: 3.62 MIL/uL — ABNORMAL LOW (ref 3.87–5.11)
RDW: 14.5 % (ref 11.5–15.5)
WBC: 12 10*3/uL — ABNORMAL HIGH (ref 4.0–10.5)
nRBC: 0 % (ref 0.0–0.2)

## 2022-10-03 LAB — BASIC METABOLIC PANEL
Anion gap: 7 (ref 5–15)
BUN: <5 mg/dL — ABNORMAL LOW (ref 6–20)
CO2: 28 mmol/L (ref 22–32)
Calcium: 8.8 mg/dL — ABNORMAL LOW (ref 8.9–10.3)
Chloride: 103 mmol/L (ref 98–111)
Creatinine, Ser: 0.55 mg/dL (ref 0.44–1.00)
GFR, Estimated: >60 mL/min (ref >60)
Glucose, Bld: 89 mg/dL (ref 70–99)
Potassium: 3.6 mmol/L (ref 3.5–5.1)
Sodium: 138 mmol/L (ref 135–145)

## 2022-10-03 MED ORDER — SODIUM CHLORIDE 0.9 % IV SOLN: INTRAVENOUS | Status: AC

## 2022-10-03 MED ORDER — HYDROXYZINE HCL 25 MG PO TABS
25.0000 mg | ORAL_TABLET | Freq: Once | ORAL | Status: AC
  Administered 2022-10-03: 25 mg via ORAL
  Filled 2022-10-03: qty 1

## 2022-10-03 NOTE — Progress Notes (Signed)
Docia Furl NP notified pt c/o anxiety, orders given

## 2022-10-03 NOTE — TOC Initial Note (Signed)
Transition of Care [facility]) - Initial/Assessment Note    Patient Details  Name: Courtney Swanson MRN: [id number] Date of Birth: 1995-10-18  Transition of Care [facility]) CM/SW Contact:    Margarito Liner, LCSW Phone Number: 10/03/2022, 11:12 AM  Clinical Narrative:   CSW met with patient. No supports at bedside. CSW introduced role and inquired about interest in SA resources. Patient declined. She stated she has a Therapist, nutritional at [facility] [employer] clinic that can assist her if needed. CSW encouraged her to let RN know if she changes her mind so resources can be printed off prior to discharge. No further concerns. CSW will continue to follow patient for support and facilitate return home once stable.               Expected Discharge Plan: Home/Self Care Barriers to Discharge: Continued Medical Work up   Patient Goals and CMS Choice            Expected Discharge Plan and Services     Post Acute Care Choice: NA Living arrangements for the past 2 months: Single Family Home                                      Prior Living Arrangements/Services Living arrangements for the past 2 months: Single Family Home   Patient language and need for interpreter reviewed:: Yes Do you feel safe going back to the place where you live?: Yes      Need for Family Participation in Patient Care: Yes (Comment) Care giver support system in place?: Yes (comment)   Criminal Activity/Legal Involvement Pertinent to Current Situation/Hospitalization: No - Comment as needed  Activities of Daily Living Home Assistive Devices/Equipment: None ADL Screening (condition at time of admission) Patient's cognitive ability adequate to safely complete daily activities?: Yes Is the patient deaf or have difficulty hearing?: No Does the patient have difficulty seeing, even when wearing glasses/contacts?: No Does the patient have difficulty concentrating, remembering, or making decisions?: No Patient able to  express need for assistance with ADLs?: Yes Does the patient have difficulty dressing or bathing?: No Independently performs ADLs?: Yes (appropriate for developmental age) Does the patient have difficulty walking or climbing stairs?: No Weakness of Legs: None Weakness of Arms/Hands: None  Permission Sought/Granted                  Emotional Assessment Appearance:: Appears stated age Attitude/Demeanor/Rapport: Engaged Affect (typically observed): Appropriate, Calm Orientation: : Oriented to Self, Oriented to Place, Oriented to  Time, Oriented to Situation Alcohol / Substance Use: Alcohol Use Psych Involvement: No (comment)  Admission diagnosis:  Alcohol-induced acute pancreatitis [K85.20] Alcohol-induced acute pancreatitis without infection or necrosis [K85.20] Patient Active Problem List   Diagnosis Date Noted   Alcohol-induced acute pancreatitis 10/01/2022   Tobacco dependence 07/25/2022   Alcoholic pancreatitis 07/24/2022   Alcohol abuse 07/24/2022   Mood disorder (HCC) 07/24/2022   Hypertension 07/24/2022   History of substance abuse (HCC) 07/24/2022   Vaginal discharge in pregnancy in second trimester 12/28/2021   Supervision of high risk pregnancy in second trimester 08/06/2021   PCP:  [facility]:   [facility] [address] ([address]), [place] - [address] Jerilynn M[place]) [place] [address] Phone: [phone] Fax: [phone]     Social Determinants of Health (SDOH) Social History: SDOH Screenings   Depression (PHQ2-9): Low  Risk  (07/28/2021)  Tobacco Use: High Risk (10/01/2022)   SDOH Interventions:     Readmission Risk Interventions     No data to display

## 2022-10-03 NOTE — Plan of Care (Signed)
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
## 2022-10-04 DIAGNOSIS — K852 Alcohol induced acute pancreatitis without necrosis or infection: Secondary | ICD-10-CM | POA: Diagnosis not present

## 2022-10-04 LAB — CBC WITH DIFFERENTIAL/PLATELET
Abs Immature Granulocytes: 0.03 10*3/uL (ref 0.00–0.07)
Basophils Absolute: 0 10*3/uL (ref 0.0–0.1)
Basophils Relative: 0 %
Eosinophils Absolute: 0.2 10*3/uL (ref 0.0–0.5)
Eosinophils Relative: 2 %
HCT: 31.5 % — ABNORMAL LOW (ref 36.0–46.0)
Hemoglobin: 10.4 g/dL — ABNORMAL LOW (ref 12.0–15.0)
Immature Granulocytes: 0 %
Lymphocytes Relative: 33 %
Lymphs Abs: 2.5 10*3/uL (ref 0.7–4.0)
MCH: 32.4 pg (ref 26.0–34.0)
MCHC: 33 g/dL (ref 30.0–36.0)
MCV: 98.1 fL (ref 80.0–100.0)
Monocytes Absolute: 0.5 10*3/uL (ref 0.1–1.0)
Monocytes Relative: 7 %
Neutro Abs: 4.2 10*3/uL (ref 1.7–7.7)
Neutrophils Relative %: 58 %
Platelets: 219 10*3/uL (ref 150–400)
RBC: 3.21 MIL/uL — ABNORMAL LOW (ref 3.87–5.11)
RDW: 14.1 % (ref 11.5–15.5)
WBC: 7.4 10*3/uL (ref 4.0–10.5)
nRBC: 0 % (ref 0.0–0.2)

## 2022-10-04 LAB — BASIC METABOLIC PANEL
Anion gap: 6 (ref 5–15)
BUN: <5 mg/dL — ABNORMAL LOW (ref 6–20)
CO2: 25 mmol/L (ref 22–32)
Calcium: 8.2 mg/dL — ABNORMAL LOW (ref 8.9–10.3)
Chloride: 105 mmol/L (ref 98–111)
Creatinine, Ser: 0.47 mg/dL (ref 0.44–1.00)
GFR, Estimated: >60 mL/min (ref >60)
Glucose, Bld: 86 mg/dL (ref 70–99)
Potassium: 3.6 mmol/L (ref 3.5–5.1)
Sodium: 136 mmol/L (ref 135–145)

## 2022-10-04 MED ORDER — CYCLOBENZAPRINE HCL 10 MG PO TABS
10.0000 mg | ORAL_TABLET | Freq: Three times a day (TID) | ORAL | Status: DC | PRN
  Administered 2022-10-04 – 2022-10-07 (×9): 10 mg via ORAL
  Filled 2022-10-04 (×9): qty 1

## 2022-10-04 MED ORDER — TERBINAFINE HCL 1 % EX CREA
TOPICAL_CREAM | Freq: Two times a day (BID) | CUTANEOUS | Status: DC
  Filled 2022-10-04: qty 12
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
## 2022-10-05 DIAGNOSIS — F1911 Other psychoactive substance abuse, in remission: Secondary | ICD-10-CM | POA: Diagnosis not present

## 2022-10-05 DIAGNOSIS — K852 Alcohol induced acute pancreatitis without necrosis or infection: Secondary | ICD-10-CM | POA: Diagnosis not present

## 2022-10-05 DIAGNOSIS — I10 Essential (primary) hypertension: Secondary | ICD-10-CM | POA: Diagnosis not present

## 2022-10-05 DIAGNOSIS — F1010 Alcohol abuse, uncomplicated: Secondary | ICD-10-CM | POA: Diagnosis not present

## 2022-10-05 LAB — CBC WITH DIFFERENTIAL/PLATELET
Abs Immature Granulocytes: 0.01 10*3/uL (ref 0.00–0.07)
Basophils Absolute: 0 10*3/uL (ref 0.0–0.1)
Basophils Relative: 0 %
Eosinophils Absolute: 0.1 10*3/uL (ref 0.0–0.5)
Eosinophils Relative: 2 %
HCT: 33.7 % — ABNORMAL LOW (ref 36.0–46.0)
Hemoglobin: 11.3 g/dL — ABNORMAL LOW (ref 12.0–15.0)
Immature Granulocytes: 0 %
Lymphocytes Relative: 38 %
Lymphs Abs: 2.5 10*3/uL (ref 0.7–4.0)
MCH: 32.5 pg (ref 26.0–34.0)
MCHC: 33.5 g/dL (ref 30.0–36.0)
MCV: 96.8 fL (ref 80.0–100.0)
Monocytes Absolute: 0.5 10*3/uL (ref 0.1–1.0)
Monocytes Relative: 7 %
Neutro Abs: 3.4 10*3/uL (ref 1.7–7.7)
Neutrophils Relative %: 53 %
Platelets: 240 10*3/uL (ref 150–400)
RBC: 3.48 MIL/uL — ABNORMAL LOW (ref 3.87–5.11)
RDW: 14.2 % (ref 11.5–15.5)
WBC: 6.5 10*3/uL (ref 4.0–10.5)
nRBC: 0 % (ref 0.0–0.2)

## 2022-10-05 LAB — BASIC METABOLIC PANEL
Anion gap: 7 (ref 5–15)
BUN: <5 mg/dL — ABNORMAL LOW (ref 6–20)
CO2: 28 mmol/L (ref 22–32)
Calcium: 8.8 mg/dL — ABNORMAL LOW (ref 8.9–10.3)
Chloride: 103 mmol/L (ref 98–111)
Creatinine, Ser: 0.6 mg/dL (ref 0.44–1.00)
GFR, Estimated: >60 mL/min (ref >60)
Glucose, Bld: 100 mg/dL — ABNORMAL HIGH (ref 70–99)
Potassium: 4.1 mmol/L (ref 3.5–5.1)
Sodium: 138 mmol/L (ref 135–145)

## 2022-10-05 MED ORDER — ACETAMINOPHEN 325 MG PO TABS
650.0000 mg | ORAL_TABLET | Freq: Four times a day (QID) | ORAL | Status: DC | PRN
  Administered 2022-10-06: 650 mg via ORAL
  Filled 2022-10-05: qty 2

## 2022-10-05 MED ORDER — ACETAMINOPHEN 650 MG RE SUPP: 650.0000 mg | Freq: Four times a day (QID) | RECTAL | Status: DC | PRN
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
## 2022-10-05 NOTE — Plan of Care (Signed)
  Problem: Education: Goal: Knowledge of General Education information will improve Description: Including pain rating scale, medication(s)/side effects and non-pharmacologic comfort measures Outcome: Progressing   Problem: Clinical Measurements: Goal: Ability to maintain clinical measurements within normal limits will improve Outcome: Progressing Goal: Will remain free from infection Outcome: Progressing   
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
## 2022-10-06 ENCOUNTER — Encounter: Payer: Self-pay | Admitting: Internal Medicine

## 2022-10-06 LAB — LIPASE, BLOOD: Lipase: 79 U/L — ABNORMAL HIGH (ref 11–51)

## 2022-10-06 LAB — COMPREHENSIVE METABOLIC PANEL
ALT: 8 U/L (ref 0–44)
AST: 14 U/L — ABNORMAL LOW (ref 15–41)
Albumin: 3.3 g/dL — ABNORMAL LOW (ref 3.5–5.0)
Alkaline Phosphatase: 43 U/L (ref 38–126)
Anion gap: 8 (ref 5–15)
BUN: 6 mg/dL (ref 6–20)
CO2: 29 mmol/L (ref 22–32)
Calcium: 9.1 mg/dL (ref 8.9–10.3)
Chloride: 100 mmol/L (ref 98–111)
Creatinine, Ser: 0.57 mg/dL (ref 0.44–1.00)
GFR, Estimated: >60 mL/min (ref >60)
Glucose, Bld: 103 mg/dL — ABNORMAL HIGH (ref 70–99)
Potassium: 3.6 mmol/L (ref 3.5–5.1)
Sodium: 137 mmol/L (ref 135–145)
Total Bilirubin: 0.5 mg/dL (ref 0.3–1.2)
Total Protein: 7.5 g/dL (ref 6.5–8.1)

## 2022-10-06 MED ORDER — CLOTRIMAZOLE 1 % EX CREA
TOPICAL_CREAM | Freq: Two times a day (BID) | CUTANEOUS | Status: DC
  Administered 2022-10-07: 1 via TOPICAL
  Filled 2022-10-06: qty 15

## 2022-10-06 MED ORDER — OXYCODONE HCL 5 MG PO TABS
5.0000 mg | ORAL_TABLET | ORAL | Status: DC | PRN
  Administered 2022-10-06 – 2022-10-07 (×4): 5 mg via ORAL
  Filled 2022-10-06 (×4): qty 1

## 2022-10-06 MED ORDER — HYDROMORPHONE HCL 1 MG/ML IJ SOLN
1.0000 mg | INTRAMUSCULAR | Status: DC | PRN
  Administered 2022-10-06: 1 mg via INTRAVENOUS
  Filled 2022-10-06: qty 1

## 2022-10-07 DIAGNOSIS — F1010 Alcohol abuse, uncomplicated: Secondary | ICD-10-CM | POA: Diagnosis not present

## 2022-10-07 DIAGNOSIS — I10 Essential (primary) hypertension: Secondary | ICD-10-CM | POA: Diagnosis not present

## 2022-10-07 DIAGNOSIS — K852 Alcohol induced acute pancreatitis without necrosis or infection: Secondary | ICD-10-CM | POA: Diagnosis not present

## 2022-10-07 DIAGNOSIS — F1911 Other psychoactive substance abuse, in remission: Secondary | ICD-10-CM | POA: Diagnosis not present

## 2022-10-07 LAB — LIPASE, BLOOD: Lipase: 61 U/L — ABNORMAL HIGH (ref 11–51)

## 2022-10-07 MED ORDER — OXYCODONE HCL 5 MG PO TABS: 5.0000 mg | ORAL_TABLET | Freq: Four times a day (QID) | ORAL | 0 refills | Status: DC | PRN

## 2022-10-07 MED ORDER — VITAMIN B-1 100 MG PO TABS: 100.0000 mg | ORAL_TABLET | Freq: Every day | ORAL | 0 refills | Status: DC

## 2022-10-07 MED ORDER — OXYCODONE HCL 5 MG PO TABS: 5.0000 mg | ORAL_TABLET | Freq: Four times a day (QID) | ORAL | Status: DC | PRN
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
## 2024-01-04 ENCOUNTER — Ambulatory Visit: Admitting: Physician Assistant

## 2024-01-06 ENCOUNTER — Emergency Department

## 2024-01-06 ENCOUNTER — Other Ambulatory Visit: Payer: Self-pay

## 2024-01-06 ENCOUNTER — Inpatient Hospital Stay
Admission: EM | Admit: 2024-01-06 | Discharge: 2024-01-11 | DRG: 439 | Disposition: A | Discharge status: Home / Self Care | Attending: Internal Medicine | Admitting: Internal Medicine

## 2024-01-06 DIAGNOSIS — K852 Alcohol induced acute pancreatitis without necrosis or infection: Principal | ICD-10-CM | POA: Diagnosis present

## 2024-01-06 DIAGNOSIS — Z818 Family history of other mental and behavioral disorders: Secondary | ICD-10-CM

## 2024-01-06 DIAGNOSIS — F17210 Nicotine dependence, cigarettes, uncomplicated: Secondary | ICD-10-CM | POA: Diagnosis present

## 2024-01-06 DIAGNOSIS — R112 Nausea with vomiting, unspecified: Secondary | ICD-10-CM

## 2024-01-06 DIAGNOSIS — Z8249 Family history of ischemic heart disease and other diseases of the circulatory system: Secondary | ICD-10-CM

## 2024-01-06 DIAGNOSIS — Z833 Family history of diabetes mellitus: Secondary | ICD-10-CM

## 2024-01-06 DIAGNOSIS — F1090 Alcohol use, unspecified, uncomplicated: Secondary | ICD-10-CM | POA: Diagnosis present

## 2024-01-06 DIAGNOSIS — Z79899 Other long term (current) drug therapy: Secondary | ICD-10-CM

## 2024-01-06 DIAGNOSIS — E876 Hypokalemia: Secondary | ICD-10-CM | POA: Diagnosis present

## 2024-01-06 DIAGNOSIS — I10 Essential (primary) hypertension: Secondary | ICD-10-CM | POA: Diagnosis present

## 2024-01-06 DIAGNOSIS — K859 Acute pancreatitis without necrosis or infection, unspecified: Secondary | ICD-10-CM | POA: Diagnosis not present

## 2024-01-06 DIAGNOSIS — N390 Urinary tract infection, site not specified: Secondary | ICD-10-CM | POA: Diagnosis present

## 2024-01-06 DIAGNOSIS — R1010 Upper abdominal pain, unspecified: Secondary | ICD-10-CM

## 2024-01-06 DIAGNOSIS — F1120 Opioid dependence, uncomplicated: Secondary | ICD-10-CM | POA: Diagnosis present

## 2024-01-06 DIAGNOSIS — Z888 Allergy status to other drugs, medicaments and biological substances status: Secondary | ICD-10-CM

## 2024-01-06 DIAGNOSIS — Z880 Allergy status to penicillin: Secondary | ICD-10-CM

## 2024-01-06 DIAGNOSIS — D539 Nutritional anemia, unspecified: Secondary | ICD-10-CM | POA: Diagnosis present

## 2024-01-06 LAB — URINALYSIS, ROUTINE W REFLEX MICROSCOPIC
Bilirubin Urine: NEGATIVE
Glucose, UA: NEGATIVE mg/dL
Ketones, ur: 20 mg/dL — AB
Nitrite: NEGATIVE
Protein, ur: 30 mg/dL — AB
RBC / HPF: >50 RBC/hpf (ref 0–5)
Specific Gravity, Urine: 1.019 (ref 1.005–1.030)
pH: 6 (ref 5.0–8.0)

## 2024-01-06 LAB — CBC
HCT: 26.6 % — ABNORMAL LOW (ref 36.0–46.0)
Hemoglobin: 9.3 g/dL — ABNORMAL LOW (ref 12.0–15.0)
MCH: 35.4 pg — ABNORMAL HIGH (ref 26.0–34.0)
MCHC: 35 g/dL (ref 30.0–36.0)
MCV: 101.1 fL — ABNORMAL HIGH (ref 80.0–100.0)
Platelets: 205 K/uL (ref 150–400)
RBC: 2.63 MIL/uL — ABNORMAL LOW (ref 3.87–5.11)
RDW: 16.4 % — ABNORMAL HIGH (ref 11.5–15.5)
WBC: 11.9 K/uL — ABNORMAL HIGH (ref 4.0–10.5)
nRBC: 0 % (ref 0.0–0.2)

## 2024-01-06 LAB — LIPASE, BLOOD: Lipase: 429 U/L — ABNORMAL HIGH (ref 11–51)

## 2024-01-06 LAB — COMPREHENSIVE METABOLIC PANEL WITH GFR
ALT: 11 U/L (ref 0–44)
AST: 20 U/L (ref 15–41)
Albumin: 3.6 g/dL (ref 3.5–5.0)
Alkaline Phosphatase: 43 U/L (ref 38–126)
Anion gap: 11 (ref 5–15)
BUN: 7 mg/dL (ref 6–20)
CO2: 24 mmol/L (ref 22–32)
Calcium: 8.8 mg/dL — ABNORMAL LOW (ref 8.9–10.3)
Chloride: 103 mmol/L (ref 98–111)
Creatinine, Ser: 0.45 mg/dL (ref 0.44–1.00)
GFR, Estimated: >60 mL/min
Glucose, Bld: 152 mg/dL — ABNORMAL HIGH (ref 70–99)
Potassium: 3 mmol/L — ABNORMAL LOW (ref 3.5–5.1)
Sodium: 138 mmol/L (ref 135–145)
Total Bilirubin: 0.9 mg/dL (ref 0.0–1.2)
Total Protein: 7.2 g/dL (ref 6.5–8.1)

## 2024-01-06 LAB — POC URINE PREG, ED: Preg Test, Ur: NEGATIVE

## 2024-01-06 MED ORDER — LACTATED RINGERS IV BOLUS
1000.0000 mL | Freq: Once | INTRAVENOUS | Status: AC
  Administered 2024-01-06: 1000 mL via INTRAVENOUS

## 2024-01-06 MED ORDER — MORPHINE SULFATE (PF) 2 MG/ML IV SOLN
2.0000 mg | Freq: Once | INTRAVENOUS | Status: AC
  Administered 2024-01-06: 2 mg via INTRAVENOUS
  Filled 2024-01-06: qty 1

## 2024-01-06 MED ORDER — ACETAMINOPHEN 325 MG PO TABS: 650.0000 mg | ORAL_TABLET | Freq: Four times a day (QID) | ORAL | Status: DC | PRN

## 2024-01-06 MED ORDER — HYDROMORPHONE HCL 1 MG/ML IJ SOLN
0.5000 mg | INTRAMUSCULAR | Status: DC | PRN
  Administered 2024-01-06 – 2024-01-09 (×16): 1 mg via INTRAVENOUS
  Filled 2024-01-06 (×16): qty 1

## 2024-01-06 MED ORDER — ONDANSETRON HCL 4 MG/2ML IJ SOLN
4.0000 mg | Freq: Four times a day (QID) | INTRAMUSCULAR | Status: DC | PRN
  Administered 2024-01-06 – 2024-01-11 (×7): 4 mg via INTRAVENOUS
  Filled 2024-01-06 (×9): qty 2

## 2024-01-06 MED ORDER — ONDANSETRON HCL 4 MG PO TABS
4.0000 mg | ORAL_TABLET | Freq: Four times a day (QID) | ORAL | Status: DC | PRN
Start: 2024-01-06 — End: 2024-01-11
  Administered 2024-01-07 – 2024-01-10 (×3): 4 mg via ORAL
  Filled 2024-01-06 (×5): qty 1

## 2024-01-06 MED ORDER — POTASSIUM CHLORIDE 10 MEQ/100ML IV SOLN
10.0000 meq | Freq: Once | INTRAVENOUS | Status: AC
  Administered 2024-01-06: 10 meq via INTRAVENOUS
  Filled 2024-01-06: qty 100

## 2024-01-06 MED ORDER — POTASSIUM CHLORIDE 10 MEQ/100ML IV SOLN
10.0000 meq | INTRAVENOUS | Status: AC
  Administered 2024-01-07 (×4): 10 meq via INTRAVENOUS
  Filled 2024-01-06 (×2): qty 100

## 2024-01-06 MED ORDER — ACETAMINOPHEN 325 MG RE SUPP: 650.0000 mg | Freq: Four times a day (QID) | RECTAL | Status: DC | PRN

## 2024-01-06 MED ORDER — ACETAMINOPHEN 650 MG RE SUPP: 650.0000 mg | Freq: Four times a day (QID) | RECTAL | Status: DC | PRN

## 2024-01-06 MED ORDER — LACTATED RINGERS IV SOLN: INTRAVENOUS | Status: AC

## 2024-01-06 MED ORDER — ONDANSETRON 4 MG PO TBDP
4.0000 mg | ORAL_TABLET | Freq: Once | ORAL | Status: AC
  Administered 2024-01-06: 4 mg via ORAL
  Filled 2024-01-06: qty 1

## 2024-01-06 MED ORDER — OXYCODONE HCL 5 MG PO TABS
5.0000 mg | ORAL_TABLET | ORAL | Status: DC | PRN
  Administered 2024-01-07 – 2024-01-08 (×7): 5 mg via ORAL
  Filled 2024-01-06 (×8): qty 1

## 2024-01-06 MED ORDER — ACETAMINOPHEN 500 MG PO TABS
1000.0000 mg | ORAL_TABLET | Freq: Once | ORAL | Status: AC
  Administered 2024-01-06: 1000 mg via ORAL
  Filled 2024-01-06: qty 2

## 2024-01-06 MED ORDER — SODIUM CHLORIDE 0.9 % IV SOLN
1.0000 g | Freq: Once | INTRAVENOUS | Status: AC
  Administered 2024-01-06: 1 g via INTRAVENOUS
  Filled 2024-01-06: qty 10

## 2024-01-06 MED ORDER — THIAMINE MONONITRATE 100 MG PO TABS
100.0000 mg | ORAL_TABLET | Freq: Every day | ORAL | Status: DC
  Administered 2024-01-07 – 2024-01-11 (×5): 100 mg via ORAL
  Filled 2024-01-06 (×5): qty 1

## 2024-01-06 MED ORDER — PANTOPRAZOLE SODIUM 40 MG IV SOLR
40.0000 mg | INTRAVENOUS | Status: DC
  Administered 2024-01-06 – 2024-01-10 (×5): 40 mg via INTRAVENOUS
  Filled 2024-01-06 (×6): qty 10

## 2024-01-06 MED ORDER — NICOTINE 21 MG/24HR TD PT24: 21.0000 mg | MEDICATED_PATCH | Freq: Every day | TRANSDERMAL | Status: DC

## 2024-01-06 MED ORDER — HYDRALAZINE HCL 20 MG/ML IJ SOLN: 5.0000 mg | INTRAMUSCULAR | Status: DC | PRN

## 2024-01-06 MED ORDER — IOHEXOL 300 MG/ML  SOLN
100.0000 mL | Freq: Once | INTRAMUSCULAR | Status: AC | PRN
  Administered 2024-01-06: 100 mL via INTRAVENOUS

## 2024-01-06 MED ORDER — AMLODIPINE BESYLATE 5 MG PO TABS
5.0000 mg | ORAL_TABLET | Freq: Every day | ORAL | Status: DC
  Administered 2024-01-06 – 2024-01-07 (×2): 5 mg via ORAL
  Filled 2024-01-06 (×2): qty 1

## 2024-01-06 MED ORDER — ONDANSETRON HCL 4 MG/2ML IJ SOLN
4.0000 mg | Freq: Once | INTRAMUSCULAR | Status: AC
  Administered 2024-01-06: 4 mg via INTRAVENOUS
  Filled 2024-01-06: qty 2

## 2024-01-06 MED ORDER — ENOXAPARIN SODIUM 40 MG/0.4ML IJ SOSY: 40.0000 mg | PREFILLED_SYRINGE | INTRAMUSCULAR | Status: DC

## 2024-01-06 NOTE — ED Triage Notes (Signed)
 C/o ABD pain that started this AM. N/V. Denies diarrhea. PMH: pancreatitis. GCS 15.

## 2024-01-06 NOTE — ED Notes (Signed)
 Pt vomited immediately after taking tylenol /zofran 

## 2024-01-06 NOTE — Assessment & Plan Note (Signed)
 Likely secondary to alcohol use  slightly decreased from baseline Continue to monitor

## 2024-01-06 NOTE — ED Provider Notes (Signed)
 [facility] Provider Note    Event Date/Time   First MD Initiated Contact with Patient 01/06/24 RONOLD     (approximate)   History   No chief complaint on file.   HPI  Courtney Swanson is a 28 y.o. female hypertension, pancreatitis, presenting with abdominal pain.  States that started today, associated with nausea and vomiting.  No diarrhea, she denies any chest pain.  No fevers, cough, urinary symptoms.  States that the pain is very similar to her pancreatitis.  On independent review, she was admitted 22 4 for alcohol related pancreatitis, had a CT scan that was done at the time that showed edema to the pancreatic head and body with stranding.     Physical Exam   Triage Vital Signs: ED Triage Vitals  Encounter Vitals Group     BP 01/06/24 1652 (!) 167/105     Girls Systolic BP Percentile --      Girls Diastolic BP Percentile --      Boys Systolic BP Percentile --      Boys Diastolic BP Percentile --      Pulse Rate 01/06/24 1648 65     Resp 01/06/24 1648 18     Temp 01/06/24 1652 97.6 F (36.4 C)     Temp Source 01/06/24 1652 Oral     SpO2 01/06/24 1648 99 %     Weight --      Height --      Head Circumference --      Peak Flow --      Pain Score 01/06/24 1645 10     Pain Loc --      Pain Education --      Exclude from Growth Chart --     Most recent vital signs: Vitals:   01/06/24 1930 01/06/24 2000  BP: (!) 170/94 (!) 170/100  Pulse: 70 62  Resp: 18   Temp:    SpO2: 100% 100%     General: Awake, no distress.  CV:  Good peripheral perfusion.  Resp:  Normal effort.  No tachypnea or respiratory distress, Abd:  No distention.  Abdomen is soft, tender at the upper quadrants as well as left lower quadrant, no guarding Other:  Moving all 4 extremities without difficulty, mildly dry mucous membranes   ED Results / Procedures / Treatments   Labs (all labs ordered are listed, but only abnormal results are displayed) Labs Reviewed   LIPASE, BLOOD - Abnormal; Notable for the following components:      Result Value   Lipase 429 (*)    All other components within normal limits  COMPREHENSIVE METABOLIC PANEL WITH GFR - Abnormal; Notable for the following components:   Potassium 3.0 (*)    Glucose, Bld 152 (*)    Calcium  8.8 (*)    All other components within normal limits  CBC - Abnormal; Notable for the following components:   WBC 11.9 (*)    RBC 2.63 (*)    Hemoglobin 9.3 (*)    HCT 26.6 (*)    MCV 101.1 (*)    MCH 35.4 (*)    RDW 16.4 (*)    All other components within normal limits  URINALYSIS, ROUTINE W REFLEX MICROSCOPIC - Abnormal; Notable for the following components:   Color, Urine YELLOW (*)    APPearance HAZY (*)    Hgb urine dipstick MODERATE (*)    Ketones, ur 20 (*)    Protein, ur 30 (*)  Leukocytes,Ua TRACE (*)    Bacteria, UA RARE (*)    All other components within normal limits  POC URINE PREG, ED     EKG  EKG shows, EKG shows sinus rhythm, rate 63, normal QS, normal QTc, no obvious ischemic ST elevation, T wave flattening in 1, aVL, not significant change compared to prior   RADIOLOGY On my independent interpretation, CT shows fluid around the pancreas concerning for pancreatitis   PROCEDURES:  Critical Care performed: No  Procedures   MEDICATIONS ORDERED IN ED: Medications  lactated ringers  bolus 1,000 mL (has no administration in time range)  acetaminophen  (TYLENOL ) tablet 1,000 mg (1,000 mg Oral Given 01/06/24 1645)  ondansetron  (ZOFRAN -ODT) disintegrating tablet 4 mg (4 mg Oral Given 01/06/24 1645)  morphine  (PF) 2 MG/ML injection 2 mg (2 mg Intravenous Given 01/06/24 1947)  cefTRIAXone  (ROCEPHIN ) 1 g in sodium chloride  0.9 % 100 mL IVPB (0 g Intravenous Stopped 01/06/24 2048)  potassium chloride  10 mEq in 100 mL IVPB (10 mEq Intravenous New Bag/Given 01/06/24 2122)  ondansetron  (ZOFRAN ) injection 4 mg (4 mg Intravenous Given 01/06/24 1946)  iohexol  (OMNIPAQUE ) 300 MG/ML  solution 100 mL (100 mLs Intravenous Contrast Given 01/06/24 2018)     IMPRESSION / MDM / ASSESSMENT AND PLAN / ED COURSE  I reviewed the triage vital signs and the nursing notes.                              Differential diagnosis includes, but is not limited to, pancreatitis, gastritis, GERD, gastroparesis, colitis, diverticulitis, kidney stone, UTI, dehydration, electrolyte derangements.  Will get labs, EKG, CT abdomen pelvis, fluids, IV morphine , IV Zofran .  Patient's presentation is most consistent with acute presentation with potential threat to life or bodily function.  Independent interpretation of labs and imaging below.  Labs are consistent with pancreatitis, also with UTI, she is pending CT at this time.  CT showed pancreatitis.  She will need to be admitted for further management and symptomatic control.  Consult to hospitalist was agreeable with the plan for admission and will evaluate the patient.  She is admitted.    Clinical Course as of 01/06/24 2225  Sat Jan 06, 2024  8151 Pending review of labs, pregnancy test is negative, lipase is elevated, she is hypokalemic, will replete, LFTs are normal, UA is consistent with UTI, does have microscopic hematuria, will get a CT.  Does have leukocytosis.  Will give her antibiotics for the UTI and plan to have her admitted for further management. [TT]  2101 CT ABDOMEN PELVIS W CONTRAST IMPRESSION: Edema surrounding the pancreas compatible with acute pancreatitis.  Heterogeneous enhancement throughout the liver with periportal edema. This can be seen with inflammatory processes such as hepatitis. Recommend clinical correlation.   [TT]    Clinical Course User Index [TT] Waymond Lorelle Cummins, MD     FINAL CLINICAL IMPRESSION(S) / ED DIAGNOSES   Final diagnoses:  Acute pancreatitis, unspecified complication status, unspecified pancreatitis type  Urinary tract infection without hematuria, site unspecified  Pain of upper abdomen  Nausea  and vomiting, unspecified vomiting type  Hypokalemia     Rx / DC Orders   ED Discharge Orders     None        Note:  This document was prepared using Dragon voice recognition software and may include unintentional dictation errors.    Waymond Lorelle Cummins, MD 01/06/24 2225

## 2024-01-06 NOTE — Assessment & Plan Note (Signed)
 IV Rocephin  Follow cultures

## 2024-01-06 NOTE — Assessment & Plan Note (Signed)
 Continue amlodipine

## 2024-01-06 NOTE — ED Notes (Signed)
 First nurse note: Hx pancreatitis, [facility] from home for abdominal pain. EMS IV ripped out. Pt received 75mcg fentanyl  IV and 4mg  zofran  en route. Pt is moaning, help me, help me in lobby. Pt vomited en route.  EMS VSS.

## 2024-01-06 NOTE — Progress Notes (Signed)
 PHARMACY CONSULT NOTE - FOLLOW UP  Pharmacy Consult for Electrolyte Monitoring and Replacement   Recent Labs: Potassium (mmol/L)  Date Value  01/06/2024 3.0 (L)  01/26/2014 3.7   Magnesium  (mg/dL)  Date Value  96/91/7975 2.0   Calcium  (mg/dL)  Date Value  91/83/7974 8.8 (L)   Calcium , Total (mg/dL)  Date Value  90/93/7984 9.7   Albumin (g/dL)  Date Value  91/83/7974 3.6  01/26/2014 4.1   Phosphorus (mg/dL)  Date Value  96/91/7975 2.0 (L)   Sodium (mmol/L)  Date Value  01/06/2024 138  01/26/2014 136     Assessment: 8/16 @ 1652:  K = 3.0                         Ca = 8.8,  Alb = 3.6,  Corrected Ca = 9.12                           Goal of Therapy:  Electrolytes WNL   Plan:  KCl 10 mEq IV X 1 given in ED on 8/16 @ 1953 - will order additional KCl 10 mEq IV X 4 and recheck electrolytes on 8/17 with AM labs.   Silver Selinda BIRCH ,PharmD Clinical Pharmacist 01/06/2024 10:46 PM

## 2024-01-06 NOTE — H&P (Signed)
 History and Physical    Patient: Courtney Swanson FMW:[id number] DOB: 09-Feb-1996 DOA: 01/06/2024 DOS: the patient was seen and examined on 01/06/2024 PCP: [facility]  Patient coming from: Home  Chief Complaint: No chief complaint on file.   HPI: NOELLE Swanson is a 28 y.o. female with medical history significant for Prior history of polysubstance abuse including alcohol and opiates, HTN, recurrent alcohol induced pancreatitis presenting with abdominal pain, nausea and vomiting.  She states she uses very little alcohol. In the ED: Hypertensive with otherwise normal vitals.  Lipase 429 with normal LFTs.  Potassium 3, urinalysis showing trace leuks and rare bacteria.  CBC 11.9 with hemoglobin 9.3 down from baseline of 10.4-11.6 with MCV 101.  EKG showing NSR at 63.  CT abdomen and pelvis consistent with acute pancreatitis. Patient treated with antiemetics, LR and given IV potassium repletion.  Started on Rocephin  for UTI. Admission requested     Review of Systems: As mentioned in the history of present illness. All other systems reviewed and are negative.  Past Medical History:  Diagnosis Date   Asthma    Pancreatitis    Substance use disorder    drug of choice oxycodone , last use 2021   Past Surgical History:  Procedure Laterality Date   NO PAST SURGERIES     Social History:  reports that she has been smoking cigarettes. She has a 2 pack-year smoking history. She has never used smokeless tobacco. She reports that she does not currently use alcohol after a past usage of about 3.0 standard drinks of alcohol per week. She reports that she does not currently use drugs after having used the following drugs: Hydrocodone .  Allergies  Allergen Reactions   Penicillins Other (See Comments)    Mother told me I was allergic  Mother told me I was allergic   Amoxicillin Other (See Comments)    Mother told me I was allergic   Duloxetine Hcl Other (See Comments)     SLEEPINESS/GROGGY    Family History  Problem Relation Age of Onset   Hypertension Mother    Diabetes Mother    Bipolar disorder Mother    Post-traumatic stress disorder Mother    Depression Mother     Prior to Admission medications   Medication Sig Start Date End Date Taking? Authorizing Provider  amLODipine  (NORVASC ) 5 MG tablet Take 1 tablet (5 mg total) by mouth daily. 07/29/22   Gonfa, Taye T, MD  buprenorphine -naloxone  (SUBOXONE ) 8-2 mg SUBL SL tablet Place 1 tablet under the tongue 3 (three) times daily.    [provider]  EPINEPHrine  0.3 mg/0.3 mL IJ SOAJ injection Inject 0.3 mg into the muscle as needed for anaphylaxis. 11/24/20   Edelmiro Leash, MD  ketoconazole (NIZORAL) 2 % cream Apply 1 Application topically 2 (two) times daily. 09/26/22   [provider]  Multiple Vitamin (MULTIVITAMIN WITH MINERALS) TABS tablet Take 1 tablet by mouth daily. Patient not taking: Reported on 10/01/2022 07/29/22   Gonfa, Taye T, MD  nicotine  (NICODERM CQ  - DOSED IN MG/24 HOURS) 21 mg/24hr patch Place 1 patch (21 mg total) onto the skin daily. 07/29/22   Gonfa, Taye T, MD  omeprazole (PRILOSEC) 20 MG capsule Take 20 mg by mouth daily.    [provider]  oxyCODONE  (OXY IR/ROXICODONE ) 5 MG immediate release tablet Take 1 tablet (5 mg total) by mouth every 6 (six) hours as needed for severe pain or breakthrough pain. 10/07/22   Alexander, Natalie, DO  senna-docusate (SENOKOT-S) 8.6-50 MG tablet Take 1 tablet by mouth 2 (two) times daily between meals as needed for moderate constipation. 07/29/22   Gonfa, Taye T, MD  sertraline  (ZOLOFT ) 50 MG tablet Take 50 mg by mouth daily.    [provider]  thiamine  (VITAMIN B-1) 100 MG tablet Take 1 tablet (100 mg total) by mouth daily. 10/08/22   Marsa Edelman, DO    Physical Exam: Vitals:   01/06/24 1844 01/06/24 1900 01/06/24 1930 01/06/24 2000  BP:  (!) 163/84 (!) 170/94 (!) 170/100  Pulse: 68 61 70 62  Resp:   18    Temp:      TempSrc:      SpO2: 100% 100% 100% 100%   Physical Exam Vitals and nursing note reviewed.  Constitutional:      General: She is not in acute distress. HENT:     Head: Normocephalic and atraumatic.  Cardiovascular:     Rate and Rhythm: Normal rate and regular rhythm.     Heart sounds: Normal heart sounds.  Pulmonary:     Effort: Pulmonary effort is normal.     Breath sounds: Normal breath sounds.  Abdominal:     Palpations: Abdomen is soft.     Tenderness: There is abdominal tenderness in the epigastric area.  Neurological:     Mental Status: Mental status is at baseline.     Labs on Admission: I have personally reviewed following labs and imaging studies  CBC: Recent Labs  Lab 01/06/24 1652  WBC 11.9*  HGB 9.3*  HCT 26.6*  MCV 101.1*  PLT 205   Basic Metabolic Panel: Recent Labs  Lab 01/06/24 1652  NA 138  K 3.0*  CL 103  CO2 24  GLUCOSE 152*  BUN 7  CREATININE 0.45  CALCIUM  8.8*   GFR: CrCl cannot be calculated (Unknown ideal weight.). Liver Function Tests: Recent Labs  Lab 01/06/24 1652  AST 20  ALT 11  ALKPHOS 43  BILITOT 0.9  PROT 7.2  ALBUMIN 3.6   Recent Labs  Lab 01/06/24 1652  LIPASE 429*   No results for input(s): AMMONIA in the last 168 hours. Coagulation Profile: No results for input(s): INR, PROTIME in the last 168 hours. Cardiac Enzymes: No results for input(s): CKTOTAL, CKMB, CKMBINDEX, TROPONINI in the last 168 hours. BNP (last 3 results) No results for input(s): PROBNP in the last 8760 hours. HbA1C: No results for input(s): HGBA1C in the last 72 hours. CBG: No results for input(s): GLUCAP in the last 168 hours. Lipid Profile: No results for input(s): CHOL, HDL, LDLCALC, TRIG, CHOLHDL, LDLDIRECT in the last 72 hours. Thyroid Function Tests: No results for input(s): TSH, T4TOTAL, FREET4, T3FREE, THYROIDAB in the last 72 hours. Anemia Panel: No results for input(s):  VITAMINB12, FOLATE, FERRITIN, TIBC, IRON, RETICCTPCT in the last 72 hours. Urine analysis:    Component Value Date/Time   COLORURINE YELLOW (A) 01/06/2024 1721   APPEARANCEUR HAZY (A) 01/06/2024 1721   APPEARANCEUR Hazy 01/26/2014 2035   LABSPEC 1.019 01/06/2024 1721   LABSPEC 1.029 01/26/2014 2035   PHURINE 6.0 01/06/2024 1721   GLUCOSEU NEGATIVE 01/06/2024 1721   GLUCOSEU Negative 01/26/2014 2035   HGBUR MODERATE (A) 01/06/2024 1721   BILIRUBINUR NEGATIVE 01/06/2024 1721   BILIRUBINUR small (A) 07/11/2021 1600   BILIRUBINUR Negative 01/26/2014 2035   KETONESUR 20 (A) 01/06/2024 1721   PROTEINUR 30 (A) 01/06/2024 1721   UROBILINOGEN 4.0 (A) 07/11/2021 1600   NITRITE NEGATIVE 01/06/2024 1721   LEUKOCYTESUR TRACE (  A) 01/06/2024 1721   LEUKOCYTESUR Negative 01/26/2014 2035    Radiological Exams on Admission: CT ABDOMEN PELVIS W CONTRAST Result Date: 01/06/2024 CLINICAL DATA:  Hematuria, upper abdominal pain. EXAM: CT ABDOMEN AND PELVIS WITH CONTRAST TECHNIQUE: Multidetector CT imaging of the abdomen and pelvis was performed using the standard protocol following bolus administration of intravenous contrast. RADIATION DOSE REDUCTION: This exam was performed according to the departmental dose-optimization program which includes automated exposure control, adjustment of the mA and/or kV according to patient size and/or use of iterative reconstruction technique. CONTRAST:  OMNIPAQUE  IOHEXOL  300 MG/ML  SOLN COMPARISON:  10/01/2022 FINDINGS: Lower chest: Dependent atelectasis in the right lower lobe. No effusions. Hepatobiliary: Heterogeneous enhancement pattern throughout the liver with periportal edema. This can be seen with volume overload or inflammatory processes such as hepatitis. Pancreas: No focal abnormality or ductal dilatation. Edema noted surrounding the pancreas most compatible with acute pancreatitis. Spleen: No focal abnormality.  Normal size. Adrenals/Urinary Tract:  No adrenal abnormality. No focal renal abnormality. No stones or hydronephrosis. Urinary bladder is unremarkable. Stomach/Bowel: Large stool burden in the rectosigmoid colon. Stomach and small bowel decompressed. No bowel obstruction or inflammatory process. Vascular/Lymphatic: No evidence of aneurysm or adenopathy. Reproductive: Uterus and adnexa unremarkable.  No mass. Other: Small amount of free fluid in the pelvis.  No free air. Musculoskeletal: No acute bony abnormality. IMPRESSION: Edema surrounding the pancreas compatible with acute pancreatitis. Heterogeneous enhancement throughout the liver with periportal edema. This can be seen with inflammatory processes such as hepatitis. Recommend clinical correlation. Electronically Signed   By: Franky Crease M.D.   On: 01/06/2024 20:57   Data Reviewed for HPI: Relevant notes from primary care and specialist visits, past discharge summaries as available in EHR, including Care Everywhere. Prior diagnostic testing as pertinent to current admission diagnoses Updated medications and problem lists for reconciliation ED course, including vitals, labs, imaging, treatment and response to treatment Triage notes, nursing and pharmacy notes and ED provider's notes Notable results as noted above in HPI      Assessment and Plan: * Acute recurrent pancreatitis History of alcohol induced pancreatitis Patient denying current alcohol use LFTs normal Clear liquid diet IV fluids, IV pain meds IV antiemetics and IV Protonix   Urinary tract infection IV Rocephin  Follow cultures  Hypokalemia Received IV repletion in the ED Continue to monitor Pharmacy consult for electrolyte monitoring and correction  Macrocytic anemia Likely secondary to alcohol use  slightly decreased from baseline Continue to monitor  Hypertension Continue amlodipine      DVT prophylaxis: Lovenox   Consults: none  Advance Care Planning:   Code Status: Prior   Family  Communication: none  Disposition Plan: Back to previous home environment  Severity of Illness: The appropriate patient status for this patient is OBSERVATION. Observation status is judged to be reasonable and necessary in order to provide the required intensity of service to ensure the patient's safety. The patient's presenting symptoms, physical exam findings, and initial radiographic and laboratory data in the context of their medical condition is felt to place them at decreased risk for further clinical deterioration. Furthermore, it is anticipated that the patient will be medically stable for discharge from the hospital within 2 midnights of admission.   Author: Delayne LULLA Solian, MD 01/06/2024 10:30 PM  For on call review [email or web address].

## 2024-01-06 NOTE — Assessment & Plan Note (Signed)
 Received IV repletion in the ED Continue to monitor Pharmacy consult for electrolyte monitoring and correction

## 2024-01-06 NOTE — Assessment & Plan Note (Signed)
 History of alcohol induced pancreatitis Patient denying current alcohol use LFTs normal Clear liquid diet IV fluids, IV pain meds IV antiemetics and IV Protonix 

## 2024-01-07 DIAGNOSIS — E876 Hypokalemia: Secondary | ICD-10-CM | POA: Diagnosis present

## 2024-01-07 DIAGNOSIS — D539 Nutritional anemia, unspecified: Secondary | ICD-10-CM | POA: Diagnosis present

## 2024-01-07 DIAGNOSIS — N390 Urinary tract infection, site not specified: Secondary | ICD-10-CM | POA: Diagnosis present

## 2024-01-07 DIAGNOSIS — K852 Alcohol induced acute pancreatitis without necrosis or infection: Secondary | ICD-10-CM | POA: Diagnosis present

## 2024-01-07 DIAGNOSIS — F17210 Nicotine dependence, cigarettes, uncomplicated: Secondary | ICD-10-CM | POA: Diagnosis present

## 2024-01-07 DIAGNOSIS — K859 Acute pancreatitis without necrosis or infection, unspecified: Secondary | ICD-10-CM | POA: Diagnosis present

## 2024-01-07 DIAGNOSIS — Z888 Allergy status to other drugs, medicaments and biological substances status: Secondary | ICD-10-CM | POA: Diagnosis not present

## 2024-01-07 DIAGNOSIS — F1090 Alcohol use, unspecified, uncomplicated: Secondary | ICD-10-CM | POA: Diagnosis present

## 2024-01-07 DIAGNOSIS — Z79899 Other long term (current) drug therapy: Secondary | ICD-10-CM | POA: Diagnosis not present

## 2024-01-07 DIAGNOSIS — F1120 Opioid dependence, uncomplicated: Secondary | ICD-10-CM | POA: Diagnosis present

## 2024-01-07 DIAGNOSIS — Z880 Allergy status to penicillin: Secondary | ICD-10-CM | POA: Diagnosis not present

## 2024-01-07 DIAGNOSIS — I10 Essential (primary) hypertension: Secondary | ICD-10-CM | POA: Diagnosis present

## 2024-01-07 DIAGNOSIS — Z8249 Family history of ischemic heart disease and other diseases of the circulatory system: Secondary | ICD-10-CM | POA: Diagnosis not present

## 2024-01-07 DIAGNOSIS — Z818 Family history of other mental and behavioral disorders: Secondary | ICD-10-CM | POA: Diagnosis not present

## 2024-01-07 DIAGNOSIS — Z833 Family history of diabetes mellitus: Secondary | ICD-10-CM | POA: Diagnosis not present

## 2024-01-07 LAB — BASIC METABOLIC PANEL WITH GFR
Anion gap: 10 (ref 5–15)
BUN: 6 mg/dL (ref 6–20)
CO2: 23 mmol/L (ref 22–32)
Calcium: 8.5 mg/dL — ABNORMAL LOW (ref 8.9–10.3)
Chloride: 104 mmol/L (ref 98–111)
Creatinine, Ser: 0.62 mg/dL (ref 0.44–1.00)
GFR, Estimated: >60 mL/min (ref >60)
Glucose, Bld: 136 mg/dL — ABNORMAL HIGH (ref 70–99)
Potassium: 3.3 mmol/L — ABNORMAL LOW (ref 3.5–5.1)
Sodium: 137 mmol/L (ref 135–145)

## 2024-01-07 LAB — CBC
HCT: 26.4 % — ABNORMAL LOW (ref 36.0–46.0)
Hemoglobin: 9.2 g/dL — ABNORMAL LOW (ref 12.0–15.0)
MCH: 35.4 pg — ABNORMAL HIGH (ref 26.0–34.0)
MCHC: 34.8 g/dL (ref 30.0–36.0)
MCV: 101.5 fL — ABNORMAL HIGH (ref 80.0–100.0)
Platelets: 185 K/uL (ref 150–400)
RBC: 2.6 MIL/uL — ABNORMAL LOW (ref 3.87–5.11)
RDW: 16.1 % — ABNORMAL HIGH (ref 11.5–15.5)
WBC: 10.5 K/uL (ref 4.0–10.5)
nRBC: 0 % (ref 0.0–0.2)

## 2024-01-07 LAB — HIV ANTIBODY (ROUTINE TESTING W REFLEX): HIV Screen 4th Generation wRfx: NONREACTIVE

## 2024-01-07 LAB — MAGNESIUM: Magnesium: 1.6 mg/dL — ABNORMAL LOW (ref 1.7–2.4)

## 2024-01-07 MED ORDER — HYDROXYZINE HCL 50 MG PO TABS
50.0000 mg | ORAL_TABLET | Freq: Every evening | ORAL | Status: AC | PRN
  Administered 2024-01-07: 50 mg via ORAL
  Filled 2024-01-07: qty 1

## 2024-01-07 MED ORDER — SODIUM CHLORIDE 0.9 % IV SOLN
1.0000 g | INTRAVENOUS | Status: DC
  Administered 2024-01-07 – 2024-01-08 (×2): 1 g via INTRAVENOUS
  Filled 2024-01-07 (×3): qty 10

## 2024-01-07 MED ORDER — NICOTINE 21 MG/24HR TD PT24
21.0000 mg | MEDICATED_PATCH | Freq: Every day | TRANSDERMAL | Status: DC
  Administered 2024-01-07 – 2024-01-11 (×6): 21 mg via TRANSDERMAL
  Filled 2024-01-07 (×6): qty 1

## 2024-01-07 MED ORDER — POTASSIUM CHLORIDE CRYS ER 20 MEQ PO TBCR
40.0000 meq | EXTENDED_RELEASE_TABLET | Freq: Two times a day (BID) | ORAL | Status: AC
  Administered 2024-01-07 (×2): 40 meq via ORAL
  Filled 2024-01-07 (×2): qty 2

## 2024-01-07 MED ORDER — ENOXAPARIN SODIUM 40 MG/0.4ML IJ SOSY
40.0000 mg | PREFILLED_SYRINGE | INTRAMUSCULAR | Status: DC
  Administered 2024-01-07 – 2024-01-09 (×3): 40 mg via SUBCUTANEOUS
  Filled 2024-01-07 (×5): qty 0.4

## 2024-01-07 MED ORDER — MELATONIN 5 MG PO TABS
5.0000 mg | ORAL_TABLET | Freq: Every evening | ORAL | Status: DC | PRN
  Administered 2024-01-08 – 2024-01-11 (×4): 5 mg via ORAL
  Filled 2024-01-07 (×4): qty 1

## 2024-01-07 MED ORDER — BACLOFEN 10 MG PO TABS
5.0000 mg | ORAL_TABLET | Freq: Two times a day (BID) | ORAL | Status: AC | PRN
  Administered 2024-01-07 – 2024-01-08 (×3): 5 mg via ORAL
  Filled 2024-01-07 (×3): qty 1

## 2024-01-07 MED ORDER — NICOTINE 21 MG/24HR TD PT24: 21.0000 mg | MEDICATED_PATCH | Freq: Every day | TRANSDERMAL | Status: DC

## 2024-01-07 MED ORDER — PROCHLORPERAZINE EDISYLATE 10 MG/2ML IJ SOLN
5.0000 mg | Freq: Once | INTRAMUSCULAR | Status: AC
  Administered 2024-01-07: 5 mg via INTRAVENOUS
  Filled 2024-01-07: qty 1

## 2024-01-07 MED ORDER — MAGNESIUM SULFATE 2 GM/50ML IV SOLN
2.0000 g | Freq: Once | INTRAVENOUS | Status: AC
  Administered 2024-01-07: 2 g via INTRAVENOUS
  Filled 2024-01-07: qty 50

## 2024-01-07 NOTE — Plan of Care (Signed)

## 2024-01-07 NOTE — Plan of Care (Signed)
 Pt from ED; oriented to room; c/o abd pain 1mg  dilaudid  IV given; Nausea zofran  given. Problem: Clinical Measurements: Goal: Ability to maintain clinical measurements within normal limits will improve Outcome: Progressing   Problem: Coping: Goal: Level of anxiety will decrease Outcome: Progressing   Problem: Pain Managment: Goal: General experience of comfort will improve and/or be controlled Outcome: Progressing   Problem: Safety: Goal: Ability to remain free from injury will improve Outcome: Progressing

## 2024-01-07 NOTE — Progress Notes (Signed)
 PHARMACY CONSULT NOTE - FOLLOW UP  Pharmacy Consult for Electrolyte Monitoring and Replacement   Recent Labs: Potassium (mmol/L)  Date Value  01/07/2024 3.3 (L)  01/26/2014 3.7   Magnesium  (mg/dL)  Date Value  91/82/7974 1.6 (L)   Calcium  (mg/dL)  Date Value  91/82/7974 8.5 (L)   Calcium , Total (mg/dL)  Date Value  90/93/7984 9.7   Albumin (g/dL)  Date Value  91/83/7974 3.6  01/26/2014 4.1   Phosphorus (mg/dL)  Date Value  96/91/7975 2.0 (L)   Sodium (mmol/L)  Date Value  01/07/2024 137  01/26/2014 136   Corrected Calcium : 8.8     ( Ca 8.5  alb 3.6)  Assessment:  28 y.o. female with medical history significant for Prior history of polysubstance abuse including alcohol and opiates, HTN, recurrent alcohol induced pancreatitis presenting with abdominal pain, nausea and vomiting.                            Goal of Therapy:  Electrolytes WNL   Plan:  K 3.3  MD ordered KCL 40 meq bid x 2 doses Mag 1.6  Will order Magnesium  sulfate 2 gm IV x 1 Will recheck electrolytes with am labs   Suzann Allean LABOR ,PharmD Clinical Pharmacist 01/07/2024 9:29 AM

## 2024-01-07 NOTE — Progress Notes (Signed)
 PROGRESS NOTE  Courtney Swanson    DOB: Aug 04, 1995, 28 y.o.  FMW:[id number]    Code Status: Full Code   DOA: 01/06/2024   LOS: 0   Brief hospital course  Courtney Swanson is a 28 y.o. female with medical history significant for Prior history of polysubstance abuse including alcohol and opiates, HTN, recurrent alcohol induced pancreatitis presenting with abdominal pain, nausea and vomiting.  In the ED: Hypertensive with otherwise normal vitals.  Lipase 429 with normal LFTs.  Potassium 3, urinalysis showing trace leuks and rare bacteria.  CBC 11.9 with hemoglobin 9.3 down from baseline of 10.4-11.6 with MCV 101.  EKG showing NSR at 63.  CT abdomen and pelvis consistent with acute pancreatitis. Patient treated with antiemetics, LR and given IV potassium repletion.  Started on Rocephin  for UTI.  01/07/24 -pain is primarily in pelvis. Still feeling nauseas   Assessment & Plan  Principal Problem:   Acute recurrent pancreatitis Active Problems:   Urinary tract infection   Hypokalemia   Macrocytic anemia   Hypertension  Acute recurrent pancreatitis  History of alcohol induced pancreatitis LFTs normal Clear liquid diet IV fluids, IV pain meds IV antiemetics and IV Protonix    Urinary tract infection IV Rocephin  Send urine cultures cultures   Hypokalemia- improved to 3.3  hypomagnesemia- Mg++ 1.6 Received IV repletion in the ED Continue to monitor - unable to tolerate PO replacement currently. Trial again after additional antiemetics - replace Mg++   Macrocytic anemia Likely secondary to alcohol use  slightly decreased from baseline Continue to monitor   Hypertension Continue amlodipine   Body mass index is 24.1 kg/m.  VTE ppx: enoxaparin  (LOVENOX ) injection 40 mg Start: 01/07/24 0800  Diet:     Diet   Diet clear liquid Room service appropriate? Yes; Fluid consistency: Thin   Consultants: None   Subjective 01/07/24    Pt reports still intensely nauseas and with  pelvic/abdominal pain   Objective  Blood pressure (!) 168/88, pulse 64, temperature (!) 97.4 F (36.3 C), resp. rate 19, height 5' 7 (1.702 m), weight 69.8 kg, SpO2 99%, unknown if currently breastfeeding.  Intake/Output Summary (Last 24 hours) at 01/07/2024 0718 Last data filed at 01/07/2024 0323 Gross per 24 hour  Intake 1967.31 ml  Output --  Net 1967.31 ml   Filed Weights   01/06/24 2353  Weight: 69.8 kg    Physical Exam:  General: awake, alert, NAD HEENT: atraumatic, clear conjunctiva, anicteric sclera, MMM, hearing grossly normal Respiratory: normal respiratory effort. Gastrointestinal: soft, diffusely tender to palpation without guarding or rebound Nervous: A&O x3. no gross focal neurologic deficits, normal speech Extremities: moves all equally, no edema, normal tone Skin: dry, intact, normal temperature, normal color. No rashes, lesions or ulcers on exposed skin  Labs   I have personally reviewed the following labs and imaging studies CBC    Component Value Date/Time   WBC 10.5 01/07/2024 0102   RBC 2.60 (L) 01/07/2024 0102   HGB 9.2 (L) 01/07/2024 0102   HGB 16.1 (H) 01/26/2014 2035   HCT 26.4 (L) 01/07/2024 0102   HCT 49.6 (H) 01/26/2014 2035   PLT 185 01/07/2024 0102   PLT 323 01/26/2014 2035   MCV 101.5 (H) 01/07/2024 0102   MCV 90 01/26/2014 2035   MCH 35.4 (H) 01/07/2024 0102   MCHC 34.8 01/07/2024 0102   RDW 16.1 (H) 01/07/2024 0102   RDW 12.8 01/26/2014 2035   LYMPHSABS 2.5 10/05/2022 1249   LYMPHSABS 1.1 01/26/2014 2035   MONOABS  0.5 10/05/2022 1249   MONOABS 1.0 (H) 01/26/2014 2035   EOSABS 0.1 10/05/2022 1249   EOSABS 0.1 01/26/2014 2035   BASOSABS 0.0 10/05/2022 1249   BASOSABS 0.1 01/26/2014 2035      Latest Ref Rng & Units 01/07/2024    1:02 AM 01/06/2024    4:52 PM 10/06/2022    9:38 AM  BMP  Glucose 70 - 99 mg/dL 863  847  896   BUN 6 - 20 mg/dL 6  7  6    Creatinine 0.44 - 1.00 mg/dL 9.37  9.54  9.42   Sodium 135 - 145 mmol/L 137  138   137   Potassium 3.5 - 5.1 mmol/L 3.3  3.0  3.6   Chloride 98 - 111 mmol/L 104  103  100   CO2 22 - 32 mmol/L 23  24  29    Calcium  8.9 - 10.3 mg/dL 8.5  8.8  9.1     CT ABDOMEN PELVIS W CONTRAST Result Date: 01/06/2024 CLINICAL DATA:  Hematuria, upper abdominal pain. EXAM: CT ABDOMEN AND PELVIS WITH CONTRAST TECHNIQUE: Multidetector CT imaging of the abdomen and pelvis was performed using the standard protocol following bolus administration of intravenous contrast. RADIATION DOSE REDUCTION: This exam was performed according to the departmental dose-optimization program which includes automated exposure control, adjustment of the mA and/or kV according to patient size and/or use of iterative reconstruction technique. CONTRAST:  OMNIPAQUE  IOHEXOL  300 MG/ML  SOLN COMPARISON:  10/01/2022 FINDINGS: Lower chest: Dependent atelectasis in the right lower lobe. No effusions. Hepatobiliary: Heterogeneous enhancement pattern throughout the liver with periportal edema. This can be seen with volume overload or inflammatory processes such as hepatitis. Pancreas: No focal abnormality or ductal dilatation. Edema noted surrounding the pancreas most compatible with acute pancreatitis. Spleen: No focal abnormality.  Normal size. Adrenals/Urinary Tract: No adrenal abnormality. No focal renal abnormality. No stones or hydronephrosis. Urinary bladder is unremarkable. Stomach/Bowel: Large stool burden in the rectosigmoid colon. Stomach and small bowel decompressed. No bowel obstruction or inflammatory process. Vascular/Lymphatic: No evidence of aneurysm or adenopathy. Reproductive: Uterus and adnexa unremarkable.  No mass. Other: Small amount of free fluid in the pelvis.  No free air. Musculoskeletal: No acute bony abnormality. IMPRESSION: Edema surrounding the pancreas compatible with acute pancreatitis. Heterogeneous enhancement throughout the liver with periportal edema. This can be seen with inflammatory processes such  as hepatitis. Recommend clinical correlation. Electronically Signed   By: Franky Crease M.D.   On: 01/06/2024 20:57    Disposition Plan & Communication  Patient status: Observation  Admitted From: Home Planned disposition location: Home Anticipated discharge date: 8/19 pending clinical improvement   Family Communication: none    Author: Marien LITTIE Piety, DO [facility] 01/07/2024, 7:18 AM   Available by Epic secure chat 7AM-7PM. If 7PM-7AM, please contact night-coverage.  [facility] contact information found on [email or web address].

## 2024-01-08 DIAGNOSIS — K859 Acute pancreatitis without necrosis or infection, unspecified: Secondary | ICD-10-CM | POA: Diagnosis not present

## 2024-01-08 LAB — URINE CULTURE: Culture: NO GROWTH

## 2024-01-08 LAB — MAGNESIUM: Magnesium: 1.9 mg/dL (ref 1.7–2.4)

## 2024-01-08 LAB — RENAL FUNCTION PANEL
Albumin: 3 g/dL — ABNORMAL LOW (ref 3.5–5.0)
Anion gap: 10 (ref 5–15)
BUN: <5 mg/dL — ABNORMAL LOW (ref 6–20)
CO2: 26 mmol/L (ref 22–32)
Calcium: 8.2 mg/dL — ABNORMAL LOW (ref 8.9–10.3)
Chloride: 102 mmol/L (ref 98–111)
Creatinine, Ser: 0.46 mg/dL (ref 0.44–1.00)
GFR, Estimated: >60 mL/min (ref >60)
Glucose, Bld: 124 mg/dL — ABNORMAL HIGH (ref 70–99)
Phosphorus: 1.6 mg/dL — ABNORMAL LOW (ref 2.5–4.6)
Potassium: 3.4 mmol/L — ABNORMAL LOW (ref 3.5–5.1)
Sodium: 138 mmol/L (ref 135–145)

## 2024-01-08 MED ORDER — POTASSIUM & SODIUM PHOSPHATES 280-160-250 MG PO PACK
1.0000 | PACK | Freq: Once | ORAL | Status: AC
  Administered 2024-01-08: 1 via ORAL
  Filled 2024-01-08: qty 1

## 2024-01-08 MED ORDER — PROCHLORPERAZINE EDISYLATE 10 MG/2ML IJ SOLN
10.0000 mg | Freq: Four times a day (QID) | INTRAMUSCULAR | Status: AC | PRN
  Administered 2024-01-08 – 2024-01-09 (×3): 10 mg via INTRAVENOUS
  Filled 2024-01-08 (×4): qty 2

## 2024-01-08 MED ORDER — SIMETHICONE 40 MG/0.6ML PO SUSP: 40.0000 mg | Freq: Four times a day (QID) | ORAL | Status: DC

## 2024-01-08 MED ORDER — POTASSIUM CHLORIDE CRYS ER 20 MEQ PO TBCR
40.0000 meq | EXTENDED_RELEASE_TABLET | Freq: Two times a day (BID) | ORAL | Status: AC
Start: 2024-01-08 — End: 2024-01-08
  Administered 2024-01-08 (×2): 40 meq via ORAL
  Filled 2024-01-08 (×2): qty 2

## 2024-01-08 MED ORDER — BACLOFEN 10 MG PO TABS
5.0000 mg | ORAL_TABLET | Freq: Three times a day (TID) | ORAL | Status: AC
  Administered 2024-01-08 – 2024-01-09 (×5): 5 mg via ORAL
  Filled 2024-01-08 (×5): qty 1

## 2024-01-08 MED ORDER — AMLODIPINE BESYLATE 10 MG PO TABS
10.0000 mg | ORAL_TABLET | Freq: Every day | ORAL | Status: DC
  Administered 2024-01-08 – 2024-01-11 (×4): 10 mg via ORAL
  Filled 2024-01-08 (×4): qty 1

## 2024-01-08 MED ORDER — SIMETHICONE 80 MG PO CHEW
80.0000 mg | CHEWABLE_TABLET | Freq: Four times a day (QID) | ORAL | Status: AC
  Administered 2024-01-08 (×3): 80 mg via ORAL
  Filled 2024-01-08 (×3): qty 1

## 2024-01-08 NOTE — Progress Notes (Signed)
 PROGRESS NOTE  Courtney Swanson    DOB: 1995/08/14, 28 y.o.  FMW:[id number]    Code Status: Full Code   DOA: 01/06/2024   LOS: 1   Brief hospital course  Courtney Swanson is a 28 y.o. female with medical history significant for Prior history of polysubstance abuse including alcohol and opiates, HTN, recurrent alcohol induced pancreatitis presenting with abdominal pain, nausea and vomiting.  In the ED: Hypertensive with otherwise normal vitals.  Lipase 429 with normal LFTs.  Potassium 3, urinalysis showing trace leuks and rare bacteria.  CBC 11.9 with hemoglobin 9.3 down from baseline of 10.4-11.6 with MCV 101.  EKG showing NSR at 63.  CT abdomen and pelvis consistent with acute pancreatitis. Patient treated with antiemetics, LR and given IV potassium repletion.  Started on Rocephin  for UTI.  01/08/24 -moderately improved. Still symptomatic   Assessment & Plan  Principal Problem:   Acute recurrent pancreatitis Active Problems:   Urinary tract infection   Hypokalemia   Macrocytic anemia   Hypertension   Pancreatitis  Acute recurrent pancreatitis  History of alcohol induced pancreatitis LFTs normal Clear liquid diet advanced to regular per patient request Continue PRN pain meds, IV antiemetics, and IV Protonix  Adding gasx   Urinary tract infection- UxCx pending - continue IV Rocephin    Hypokalemia- improved to 3.4  hypomagnesemia- Mg++ 1.9 s/p replacement Received IV repletion Continue to monitor - electrolyte monitoring by pharmacy consult   Macrocytic anemia- slightly decreased from baseline Continue to monitor   Hypertension Continue amlodipine - increased dose due to poorly controlled on home dose  Body mass index is 24.1 kg/m.  VTE ppx: enoxaparin  (LOVENOX ) injection 40 mg Start: 01/07/24 0800  Diet:     Diet   Diet full liquid Room service appropriate? Yes; Fluid consistency: Thin   Consultants: None   Subjective 01/08/24    Pt reports still having  significant pain and states it is hunger pains so requests a sandwich and regular diet. She also wants more muscle relaxers so she can just stay asleep the whole time. Endorses mild improvement in nausea and pain today   Objective  Blood pressure (!) 168/88, pulse 64, temperature (!) 97.4 F (36.3 C), resp. rate 19, height 5' 7 (1.702 m), weight 69.8 kg, SpO2 99%, unknown if currently breastfeeding.  Intake/Output Summary (Last 24 hours) at 01/08/2024 0719 Last data filed at 01/07/2024 2153 Gross per 24 hour  Intake 50 ml  Output 500 ml  Net -450 ml   Filed Weights   01/06/24 2353  Weight: 69.8 kg    Physical Exam:  General: awake, alert, NAD HEENT: atraumatic, clear conjunctiva, anicteric sclera, MMM, hearing grossly normal Respiratory: normal respiratory effort. Gastrointestinal: soft, diffusely tender to palpation without guarding or rebound Nervous: A&O x3. no gross focal neurologic deficits, normal speech Extremities: moves all equally, no edema, normal tone Skin: dry, intact, normal temperature, normal color. No rashes, lesions or ulcers on exposed skin  Labs   I have personally reviewed the following labs and imaging studies CBC    Component Value Date/Time   WBC 10.5 01/07/2024 0102   RBC 2.60 (L) 01/07/2024 0102   HGB 9.2 (L) 01/07/2024 0102   HGB 16.1 (H) 01/26/2014 2035   HCT 26.4 (L) 01/07/2024 0102   HCT 49.6 (H) 01/26/2014 2035   PLT 185 01/07/2024 0102   PLT 323 01/26/2014 2035   MCV 101.5 (H) 01/07/2024 0102   MCV 90 01/26/2014 2035   MCH 35.4 (H) 01/07/2024 0102  MCHC 34.8 01/07/2024 0102   RDW 16.1 (H) 01/07/2024 0102   RDW 12.8 01/26/2014 2035   LYMPHSABS 2.5 10/05/2022 1249   LYMPHSABS 1.1 01/26/2014 2035   MONOABS 0.5 10/05/2022 1249   MONOABS 1.0 (H) 01/26/2014 2035   EOSABS 0.1 10/05/2022 1249   EOSABS 0.1 01/26/2014 2035   BASOSABS 0.0 10/05/2022 1249   BASOSABS 0.1 01/26/2014 2035      Latest Ref Rng & Units 01/08/2024    5:03 AM  01/07/2024    1:02 AM 01/06/2024    4:52 PM  BMP  Glucose 70 - 99 mg/dL 875  863  847   BUN 6 - 20 mg/dL 5  6  7    Creatinine 0.44 - 1.00 mg/dL 9.53  9.37  9.54   Sodium 135 - 145 mmol/L 138  137  138   Potassium 3.5 - 5.1 mmol/L 3.4  3.3  3.0   Chloride 98 - 111 mmol/L 102  104  103   CO2 22 - 32 mmol/L 26  23  24    Calcium  8.9 - 10.3 mg/dL 8.2  8.5  8.8     CT ABDOMEN PELVIS W CONTRAST Result Date: 01/06/2024 CLINICAL DATA:  Hematuria, upper abdominal pain. EXAM: CT ABDOMEN AND PELVIS WITH CONTRAST TECHNIQUE: Multidetector CT imaging of the abdomen and pelvis was performed using the standard protocol following bolus administration of intravenous contrast. RADIATION DOSE REDUCTION: This exam was performed according to the departmental dose-optimization program which includes automated exposure control, adjustment of the mA and/or kV according to patient size and/or use of iterative reconstruction technique. CONTRAST:  OMNIPAQUE  IOHEXOL  300 MG/ML  SOLN COMPARISON:  10/01/2022 FINDINGS: Lower chest: Dependent atelectasis in the right lower lobe. No effusions. Hepatobiliary: Heterogeneous enhancement pattern throughout the liver with periportal edema. This can be seen with volume overload or inflammatory processes such as hepatitis. Pancreas: No focal abnormality or ductal dilatation. Edema noted surrounding the pancreas most compatible with acute pancreatitis. Spleen: No focal abnormality.  Normal size. Adrenals/Urinary Tract: No adrenal abnormality. No focal renal abnormality. No stones or hydronephrosis. Urinary bladder is unremarkable. Stomach/Bowel: Large stool burden in the rectosigmoid colon. Stomach and small bowel decompressed. No bowel obstruction or inflammatory process. Vascular/Lymphatic: No evidence of aneurysm or adenopathy. Reproductive: Uterus and adnexa unremarkable.  No mass. Other: Small amount of free fluid in the pelvis.  No free air. Musculoskeletal: No acute bony  abnormality. IMPRESSION: Edema surrounding the pancreas compatible with acute pancreatitis. Heterogeneous enhancement throughout the liver with periportal edema. This can be seen with inflammatory processes such as hepatitis. Recommend clinical correlation. Electronically Signed   By: Franky Crease M.D.   On: 01/06/2024 20:57    Disposition Plan & Communication  Patient status: Inpatient  Admitted From: Home Planned disposition location: Home Anticipated discharge date: 8/19 pending clinical improvement   Family Communication: none    Author: Marien LITTIE Piety, DO [facility] 01/08/2024, 7:19 AM   Available by Epic secure chat 7AM-7PM. If 7PM-7AM, please contact night-coverage.  [facility] contact information found on [email or web address].

## 2024-01-08 NOTE — Plan of Care (Signed)
   Problem: Pain Managment: Goal: General experience of comfort will improve and/or be controlled Outcome: Not Progressing

## 2024-01-08 NOTE — Progress Notes (Signed)
 Attempted to see patient. Patient sleeping soundly. Will follow up at a later time.

## 2024-01-08 NOTE — Plan of Care (Signed)
  Problem: Health Behavior/Discharge Planning: Goal: Ability to manage health-related needs will improve Outcome: Progressing   Problem: Nutrition: Goal: Adequate nutrition will be maintained Outcome: Progressing   Problem: Coping: Goal: Level of anxiety will decrease Outcome: Progressing   Problem: Elimination: Goal: Will not experience complications related to bowel motility Outcome: Progressing Goal: Will not experience complications related to urinary retention Outcome: Progressing   Problem: Pain Managment: Goal: General experience of comfort will improve and/or be controlled Outcome: Progressing   Problem: Safety: Goal: Ability to remain free from injury will improve Outcome: Progressing

## 2024-01-08 NOTE — Plan of Care (Signed)

## 2024-01-08 NOTE — Progress Notes (Signed)
 PHARMACY CONSULT NOTE - FOLLOW UP  Pharmacy Consult for Electrolyte Monitoring and Replacement   Recent Labs: Potassium (mmol/L)  Date Value  01/08/2024 3.4 (L)  01/26/2014 3.7   Magnesium  (mg/dL)  Date Value  91/81/7974 1.9   Calcium  (mg/dL)  Date Value  91/81/7974 8.2 (L)   Calcium , Total (mg/dL)  Date Value  90/93/7984 9.7   Albumin (g/dL)  Date Value  91/81/7974 3.0 (L)  01/26/2014 4.1   Phosphorus (mg/dL)  Date Value  91/81/7974 1.6 (L)   Sodium (mmol/L)  Date Value  01/08/2024 138  01/26/2014 136   Corrected Calcium : 8.8  Assessment:  28 y.o. female with medical history significant for Prior history of polysubstance abuse including alcohol and opiates, HTN, recurrent alcohol induced pancreatitis presenting with abdominal pain, nausea and vomiting.                            Goal of Therapy:  Electrolytes WNL   Plan:  K 3.4: Provider has ordered KCL 40 mEq PO x2  Phos 1.6: Give Phos-NaK 250 mg PO x1 Check BMP, Mag, Phos in AM  Damien Napoleon ,PharmD Clinical Pharmacist 01/08/2024 7:42 AM

## 2024-01-09 DIAGNOSIS — K859 Acute pancreatitis without necrosis or infection, unspecified: Secondary | ICD-10-CM | POA: Diagnosis not present

## 2024-01-09 LAB — RENAL FUNCTION PANEL
Albumin: 3.1 g/dL — ABNORMAL LOW (ref 3.5–5.0)
Anion gap: 8 (ref 5–15)
BUN: <5 mg/dL — ABNORMAL LOW (ref 6–20)
CO2: 30 mmol/L (ref 22–32)
Calcium: 9 mg/dL (ref 8.9–10.3)
Chloride: 102 mmol/L (ref 98–111)
Creatinine, Ser: 0.46 mg/dL (ref 0.44–1.00)
GFR, Estimated: >60 mL/min (ref >60)
Glucose, Bld: 108 mg/dL — ABNORMAL HIGH (ref 70–99)
Phosphorus: 3.1 mg/dL (ref 2.5–4.6)
Potassium: 3.6 mmol/L (ref 3.5–5.1)
Sodium: 140 mmol/L (ref 135–145)

## 2024-01-09 LAB — MAGNESIUM: Magnesium: 2 mg/dL (ref 1.7–2.4)

## 2024-01-09 MED ORDER — HYDROMORPHONE HCL 1 MG/ML IJ SOLN
0.5000 mg | INTRAMUSCULAR | Status: DC | PRN
  Administered 2024-01-09 – 2024-01-10 (×8): 0.5 mg via INTRAVENOUS
  Filled 2024-01-09 (×8): qty 0.5

## 2024-01-09 MED ORDER — POLYETHYLENE GLYCOL 3350 17 G PO PACK
17.0000 g | PACK | Freq: Two times a day (BID) | ORAL | Status: DC
  Administered 2024-01-09 – 2024-01-11 (×5): 17 g via ORAL
  Filled 2024-01-09 (×5): qty 1

## 2024-01-09 MED ORDER — SENNA 8.6 MG PO TABS
1.0000 | ORAL_TABLET | Freq: Every day | ORAL | Status: DC
  Administered 2024-01-09 – 2024-01-11 (×3): 8.6 mg via ORAL
  Filled 2024-01-09 (×3): qty 1

## 2024-01-09 MED ORDER — GLYCERIN (LAXATIVE) 2 G RE SUPP: 1.0000 | RECTAL | Status: DC | PRN

## 2024-01-09 MED ORDER — HYDROCODONE-ACETAMINOPHEN 5-325 MG PO TABS
1.0000 | ORAL_TABLET | Freq: Four times a day (QID) | ORAL | Status: DC | PRN
  Administered 2024-01-09 – 2024-01-10 (×4): 1 via ORAL
  Filled 2024-01-09 (×4): qty 1

## 2024-01-09 MED ORDER — LORAZEPAM 0.5 MG PO TABS
0.5000 mg | ORAL_TABLET | Freq: Two times a day (BID) | ORAL | Status: AC | PRN
  Administered 2024-01-09 – 2024-01-10 (×4): 0.5 mg via ORAL
  Filled 2024-01-09 (×4): qty 1

## 2024-01-09 NOTE — Progress Notes (Signed)
 pt c/o awful stomach pain. pt sleeping when nurse went into the room because she hit her call bell. Asking for Norco and Dilaudid  right after. Also saying she needs more Ativan  because it didn't let me sleep long enough. pt has been observed sleeping most of the day whenever we walk in the room to check on her. pt observed by other nurse talking to someone on the phone telling them to bring her [place]go food. pts bedside table is loaded with food and juices. pt admits she has been eating chicken quesadillas and broccoli. Pt educated on diet and avoiding fried/processed foods. Pt asking for [place] sandwich and chips after education. Hydrocodone  given for pain. Asked for Dilaudid  right after. pt was sleeping when nurse walked in the room again. pt admitted to drinking and not eating during her kids birthday party, then eating fried foods which then started her abdominal pain. Pt c/o nausea/vomiting. No episodes of vomiting noted. Pt says the cheese in the quesadilla made her stomach hurt d/t constipation. Once again, educated pt on diet. pt interrupted and states that the foods that she is eating doesn't bother her. MD aware.

## 2024-01-09 NOTE — Progress Notes (Signed)
 PHARMACY CONSULT NOTE - FOLLOW UP  Pharmacy Consult for Electrolyte Monitoring and Replacement   Recent Labs: Potassium (mmol/L)  Date Value  01/09/2024 3.6  01/26/2014 3.7   Magnesium  (mg/dL)  Date Value  91/80/7974 2.0   Calcium  (mg/dL)  Date Value  91/80/7974 9.0   Calcium , Total (mg/dL)  Date Value  90/93/7984 9.7   Albumin (g/dL)  Date Value  91/80/7974 3.1 (L)  01/26/2014 4.1   Phosphorus (mg/dL)  Date Value  91/80/7974 3.1   Sodium (mmol/L)  Date Value  01/09/2024 140  01/26/2014 136   Corrected Calcium : 9.7  Assessment:  28 y.o. female with medical history significant for Prior history of polysubstance abuse including alcohol and opiates, HTN, recurrent alcohol induced pancreatitis presenting with abdominal pain, nausea and vomiting. Pharmacy has been consulted to monitor and replace electrolytes.  Diet: regular MIVF: N/A Pertinent medications: N/A                          Goal of Therapy:  Electrolytes WNL   Plan:  K, Mg, phos wnl --> no electrolyte replacement warranted at this time Check BMP in AM  Leonor JAYSON Argyle ,PharmD Clinical Pharmacist 01/09/2024 7:52 AM

## 2024-01-09 NOTE — Progress Notes (Signed)
 PROGRESS NOTE  Courtney Swanson    DOB: [birth year], 29 y.o.  FMW:[id number]    Code Status: Full Code   DOA: 01/06/2024   LOS: 2   Brief hospital course  Courtney Swanson is a 28 y.o. female with medical history significant for polysubstance abuse including alcohol and opiates, HTN, recurrent alcohol induced pancreatitis presenting with abdominal pain, nausea and vomiting.  In the ED: Hypertensive with otherwise normal vitals.  Lipase 429 with normal LFTs.  Potassium 3, urinalysis showing trace leuks and rare bacteria.  CBC 11.9 with hemoglobin 9.3 down from baseline of 10.4-11.6 with MCV 101.  EKG showing NSR at 63.  CT abdomen and pelvis consistent with acute pancreatitis. Patient treated with antiemetics, LR and given IV potassium repletion.  Started on Rocephin  for UTI.  01/09/24 -moderately improved. Still symptomatic   Assessment & Plan  Principal Problem:   Acute recurrent pancreatitis Active Problems:   Urinary tract infection   Hypokalemia   Macrocytic anemia   Hypertension   Pancreatitis  Acute recurrent pancreatitis  History of alcohol induced pancreatitis Lower abdominal pain- suspect constipation contributing to ongoing pain LFTs normal Clear liquid diet advanced to regular per patient request Continue PRN pain meds, IV antiemetics, and IV Protonix  Adding gasx and stool softeners, laxatives   Urinary tract infection suspected on admission- UxCx= no growth - antibiotics to stop   Hypokalemia- improved to 3.6  hypomagnesemia- Mg++ 2.0 s/p replacement Received IV repletion Continue to monitor - electrolyte monitoring by pharmacy consult   Macrocytic anemia- slightly decreased from baseline Continue to monitor   Hypertension Continue amlodipine - increased dose to 10mg  daily due to poorly controlled on home dose and has improved  Body mass index is 24.1 kg/m.  VTE ppx: enoxaparin  (LOVENOX ) injection 40 mg Start: 01/07/24 0800  Diet:     Diet   Diet  regular Room service appropriate? Yes; Fluid consistency: Thin   Consultants: None   Subjective 01/09/24    Pt reports still having significant pain and having difficulty relaxing. Asks for more pain medications and anxiety medications. Wants to just be knocked out   Objective  Blood pressure (!) 168/88, pulse 64, temperature (!) 97.4 F (36.3 C), resp. rate 19, height 5' 7 (1.702 m), weight 69.8 kg, SpO2 99%, unknown if currently breastfeeding.  Intake/Output Summary (Last 24 hours) at 01/09/2024 0713 Last data filed at 01/09/2024 0500 Gross per 24 hour  Intake 100 ml  Output --  Net 100 ml   Filed Weights   01/06/24 2353  Weight: 69.8 kg    Physical Exam:  General: awake, alert, NAD HEENT: atraumatic, clear conjunctiva, anicteric sclera, MMM, hearing grossly normal Respiratory: normal respiratory effort. Gastrointestinal: soft, diffusely tender to palpation without guarding or rebound Nervous: A&O x3. no gross focal neurologic deficits, normal speech Extremities: moves all equally, no edema, normal tone Skin: dry, intact, normal temperature, normal color. No rashes, lesions or ulcers on exposed skin  Labs   I have personally reviewed the following labs and imaging studies CBC    Component Value Date/Time   WBC 10.5 01/07/2024 0102   RBC 2.60 (L) 01/07/2024 0102   HGB 9.2 (L) 01/07/2024 0102   HGB 16.1 (H) 01/26/2014 2035   HCT 26.4 (L) 01/07/2024 0102   HCT 49.6 (H) 01/26/2014 2035   PLT 185 01/07/2024 0102   PLT 323 01/26/2014 2035   MCV 101.5 (H) 01/07/2024 0102   MCV 90 01/26/2014 2035   MCH 35.4 (H) 01/07/2024 0102  MCHC 34.8 01/07/2024 0102   RDW 16.1 (H) 01/07/2024 0102   RDW 12.8 01/26/2014 2035   LYMPHSABS 2.5 10/05/2022 1249   LYMPHSABS 1.1 01/26/2014 2035   MONOABS 0.5 10/05/2022 1249   MONOABS 1.0 (H) 01/26/2014 2035   EOSABS 0.1 10/05/2022 1249   EOSABS 0.1 01/26/2014 2035   BASOSABS 0.0 10/05/2022 1249   BASOSABS 0.1 01/26/2014 2035       Latest Ref Rng & Units 01/09/2024    4:51 AM 01/08/2024    5:03 AM 01/07/2024    1:02 AM  BMP  Glucose 70 - 99 mg/dL 891  875  863   BUN 6 - 20 mg/dL <5  <5  6   Creatinine 0.44 - 1.00 mg/dL 9.53  9.53  9.37   Sodium 135 - 145 mmol/L 140  138  137   Potassium 3.5 - 5.1 mmol/L 3.6  3.4  3.3   Chloride 98 - 111 mmol/L 102  102  104   CO2 22 - 32 mmol/L 30  26  23    Calcium  8.9 - 10.3 mg/dL 9.0  8.2  8.5     No results found.   Disposition Plan & Communication  Patient status: Inpatient  Admitted From: Home Planned disposition location: Home Anticipated discharge date: 8/20 pending clinical improvement   Family Communication: none    Author: Marien LITTIE Piety, DO [facility] 01/09/2024, 7:13 AM   Available by Epic secure chat 7AM-7PM. If 7PM-7AM, please contact night-coverage.  [facility] contact information found on [email or web address].

## 2024-01-10 DIAGNOSIS — K859 Acute pancreatitis without necrosis or infection, unspecified: Secondary | ICD-10-CM | POA: Diagnosis not present

## 2024-01-10 LAB — BASIC METABOLIC PANEL WITH GFR
Anion gap: 11 (ref 5–15)
BUN: <5 mg/dL — ABNORMAL LOW (ref 6–20)
CO2: 28 mmol/L (ref 22–32)
Calcium: 9 mg/dL (ref 8.9–10.3)
Chloride: 100 mmol/L (ref 98–111)
Creatinine, Ser: 0.52 mg/dL (ref 0.44–1.00)
GFR, Estimated: >60 mL/min (ref >60)
Glucose, Bld: 105 mg/dL — ABNORMAL HIGH (ref 70–99)
Potassium: 3.1 mmol/L — ABNORMAL LOW (ref 3.5–5.1)
Sodium: 139 mmol/L (ref 135–145)

## 2024-01-10 MED ORDER — OXYCODONE-ACETAMINOPHEN 5-325 MG PO TABS
1.0000 | ORAL_TABLET | Freq: Four times a day (QID) | ORAL | Status: DC | PRN
  Administered 2024-01-11 (×2): 1 via ORAL
  Filled 2024-01-10 (×2): qty 1

## 2024-01-10 MED ORDER — HYDROMORPHONE HCL 1 MG/ML IJ SOLN
1.0000 mg | Freq: Once | INTRAMUSCULAR | Status: AC
  Administered 2024-01-10: 1 mg via INTRAVENOUS
  Filled 2024-01-10: qty 1

## 2024-01-10 MED ORDER — BACLOFEN 10 MG PO TABS
10.0000 mg | ORAL_TABLET | Freq: Three times a day (TID) | ORAL | Status: DC | PRN
  Administered 2024-01-10: 10 mg via ORAL
  Filled 2024-01-10: qty 1

## 2024-01-10 MED ORDER — POTASSIUM CHLORIDE CRYS ER 20 MEQ PO TBCR
40.0000 meq | EXTENDED_RELEASE_TABLET | Freq: Once | ORAL | Status: AC
  Administered 2024-01-10: 40 meq via ORAL
  Filled 2024-01-10: qty 2

## 2024-01-10 NOTE — Progress Notes (Signed)
 PROGRESS NOTE    Courtney Swanson  FMW:[id number] DOB: 08/03/1995 DOA: 01/06/2024 [facility]: [facility]  Assessment & Plan:   Principal Problem:   Acute recurrent pancreatitis Active Problems:   Urinary tract infection   Hypokalemia   Macrocytic anemia   Hypertension   Pancreatitis  Assessment and Plan: Acute recurrent pancreatitis: likely secondary to continued alcohol use. Drank only a little alcohol as per pt. Continue on norco, robaxin prn. Pt requested to be completely sleep from pain meds b/c that is what she had last time she was here. Received alcohol cessation counseling   R/o UTI: urine cx showed no growth so abxs were d/c    Hypokalemia: potassium given    Macrocytic anemia: likely secondary to alcohol use. Will check B12, folate levels. No need for a transfusion currently    HTN: continue on amlodipine        DVT prophylaxis: lovenox  Code Status: full  Family Communication:  Disposition Plan: will d/c back home  Level of care: Med-Surg  Status is: Inpatient Remains inpatient appropriate because: c/o significant pain     Consultants:    Procedures:   Antimicrobials:   Subjective: Pt c/o abd pain   Objective: Vitals:   01/09/24 1824 01/09/24 1949 01/10/24 0639 01/10/24 0851  BP: 117/75 (!) 141/96 (!) 152/103 (!) 157/109  Pulse: 85 93 80 78  Resp: 16 16 16 20   Temp: 98.7 F (37.1 C) 98.7 F (37.1 C) 98.5 F (36.9 C) (!) 97.4 F (36.3 C)  TempSrc: Oral Oral    SpO2: 100% 99% 99% 100%  Weight:      Height:       No intake or output data in the 24 hours ending 01/10/24 1008 Filed Weights   01/06/24 2353  Weight: 69.8 kg    Examination:  General exam: Appears calm and comfortable  Respiratory system: Clear to auscultation. Respiratory effort normal. Cardiovascular system: S1 & S2+. No rubs, gallops or clicks. Gastrointestinal system: Abdomen is nondistended, soft and nontender. Normal bowel sounds  heard. Central nervous system: Alert and oriented. Moves all extremities  Psychiatry: Judgement and insight appear normal. Mood & affect appropriate.     Data Reviewed: I have personally reviewed following labs and imaging studies  CBC: Recent Labs  Lab 01/06/24 1652 01/07/24 0102  WBC 11.9* 10.5  HGB 9.3* 9.2*  HCT 26.6* 26.4*  MCV 101.1* 101.5*  PLT 205 185   Basic Metabolic Panel: Recent Labs  Lab 01/06/24 1652 01/07/24 0102 01/08/24 0503 01/09/24 0451 01/10/24 0450  NA 138 137 138 140 139  K 3.0* 3.3* 3.4* 3.6 3.1*  CL 103 104 102 102 100  CO2 24 23 26 30 28   GLUCOSE 152* 136* 124* 108* 105*  BUN 7 6 <5* <5* <5*  CREATININE 0.45 0.62 0.46 0.46 0.52  CALCIUM  8.8* 8.5* 8.2* 9.0 9.0  MG  --  1.6* 1.9 2.0  --   PHOS  --   --  1.6* 3.1  --    GFR: Estimated Creatinine Clearance: 100.9 mL/min (by C-G formula based on SCr of 0.52 mg/dL). Liver Function Tests: Recent Labs  Lab 01/06/24 1652 01/08/24 0503 01/09/24 0451  AST 20  --   --   ALT 11  --   --   ALKPHOS 43  --   --   BILITOT 0.9  --   --   PROT 7.2  --   --   ALBUMIN 3.6 3.0* 3.1*   Recent Labs  Lab 01/06/24 1652  LIPASE 429*   No results for input(s): AMMONIA in the last 168 hours. Coagulation Profile: No results for input(s): INR, PROTIME in the last 168 hours. Cardiac Enzymes: No results for input(s): CKTOTAL, CKMB, CKMBINDEX, TROPONINI in the last 168 hours. BNP (last 3 results) No results for input(s): PROBNP in the last 8760 hours. HbA1C: No results for input(s): HGBA1C in the last 72 hours. CBG: No results for input(s): GLUCAP in the last 168 hours. Lipid Profile: No results for input(s): CHOL, HDL, LDLCALC, TRIG, CHOLHDL, LDLDIRECT in the last 72 hours. Thyroid Function Tests: No results for input(s): TSH, T4TOTAL, FREET4, T3FREE, THYROIDAB in the last 72 hours. Anemia Panel: No results for input(s): VITAMINB12, FOLATE, FERRITIN,  TIBC, IRON, RETICCTPCT in the last 72 hours. Sepsis Labs: No results for input(s): PROCALCITON, LATICACIDVEN in the last 168 hours.  Recent Results (from the past 240 hours)  Urine Culture (for pregnant, neutropenic or urologic patients or patients with an indwelling urinary catheter)     Status: None   Collection Time: 01/06/24  5:21 PM   Specimen: Urine, Clean Catch  Result Value Ref Range Status   Specimen Description   Final    URINE, CLEAN CATCH Performed at [facility], [address] [place], [place] [address]    Special Requests   Final    NONE Performed at [facility], [address] [place], [place] [address]    Culture   Final    NO GROWTH Performed at [facility], [address], [place], [place] [address]    Report Status 01/08/2024 FINAL  Final         Radiology Studies: No results found.      Scheduled Meds:  amLODipine   10 mg Oral Daily   enoxaparin  (LOVENOX ) injection  40 mg Subcutaneous Q24H   nicotine   21 mg Transdermal Daily   pantoprazole  (PROTONIX ) IV  40 mg Intravenous Q24H   polyethylene glycol  17 g Oral BID   senna  1 tablet Oral Daily   thiamine   100 mg Oral Daily   Continuous Infusions:   LOS: 3 days       Anthony CHRISTELLA Pouch, MD [facility] Pager [phone]  If 7PM-7AM, please contact night-coverage [email or web address] 01/10/2024, 10:08 AM

## 2024-01-10 NOTE — TOC Initial Note (Addendum)
 Transition of Care [facility]) - Initial/Assessment Note    Patient Details  Name: Courtney Swanson MRN: [id number] Date of Birth: [birth year]  Transition of Care [facility] CM/SW Contact:    Dalia GORMAN Fuse, RN Phone Number: 01/10/2024, 9:44 AM  Clinical Narrative:                 Patient is from home. She is staying with her Godfather who is also the grandfather of her children. She has been approved for SSDI and is waiting on back pay. Once she receives the check she plans to get an apartment. She receives EBT and has no difficulty getting food. She has Medicaid and  sees a MD at [facility] clinic. No TOC needs at this time. Please outreach if needs are identified.       Patient Goals and CMS Choice            Expected Discharge Plan and Services                                              Prior Living Arrangements/Services                       Activities of Daily Living      Permission Sought/Granted                  Emotional Assessment              Admission diagnosis:  Acute pancreatitis [K85.90] Hypokalemia [E87.6] Pain of upper abdomen [R10.10] Urinary tract infection without hematuria, site unspecified [N39.0] Acute pancreatitis, unspecified complication status, unspecified pancreatitis type [K85.90] Nausea and vomiting, unspecified vomiting type [R11.2] Pancreatitis [K85.90] Patient Active Problem List   Diagnosis Date Noted   Pancreatitis 01/07/2024   Acute recurrent pancreatitis 01/06/2024   Urinary tract infection 01/06/2024   Macrocytic anemia 01/06/2024   Hypokalemia 01/06/2024   Alcohol-induced acute pancreatitis 10/01/2022   Tobacco dependence 07/25/2022   Alcoholic pancreatitis 07/24/2022   Alcohol abuse 07/24/2022   Mood disorder (HCC) 07/24/2022   Hypertension 07/24/2022   History of substance abuse (HCC) 07/24/2022   Vaginal discharge in pregnancy in second trimester 12/28/2021   Supervision of high risk  pregnancy in second trimester 08/06/2021   PCP:  [facility] Pharmacy:   [facility] [address] ([address]), [place] - [address] [place] ([place]) [place] [address] Phone: [phone] Fax: [phone]  [facility] [address], [place] - [address] [place] [address] Phone: [phone] Fax: [phone]     Social Drivers of Health (SDOH) Social History: SDOH Screenings   Food Insecurity: No Food Insecurity (10/06/2022)  Housing: Low Risk  (10/06/2022)  Transportation Needs: No Transportation Needs (10/06/2022)  Utilities: Not At Risk (10/06/2022)  Depression (PHQ2-9): Low Risk  (07/28/2021)  Tobacco Use: High Risk (01/06/2024)   SDOH Interventions:     Readmission Risk Interventions     No data to display

## 2024-01-10 NOTE — Progress Notes (Signed)
 PHARMACY CONSULT NOTE - FOLLOW UP  Pharmacy Consult for Electrolyte Monitoring and Replacement   Recent Labs: Potassium (mmol/L)  Date Value  01/10/2024 3.1 (L)  01/26/2014 3.7   Magnesium  (mg/dL)  Date Value  91/80/7974 2.0   Calcium  (mg/dL)  Date Value  91/79/7974 9.0   Calcium , Total (mg/dL)  Date Value  90/93/7984 9.7   Albumin (g/dL)  Date Value  91/80/7974 3.1 (L)  01/26/2014 4.1   Phosphorus (mg/dL)  Date Value  91/80/7974 3.1   Sodium (mmol/L)  Date Value  01/10/2024 139  01/26/2014 136   Corrected Calcium : 9.7  Assessment:  28 y.o. female with medical history significant for Prior history of polysubstance abuse including alcohol and opiates, HTN, recurrent alcohol induced pancreatitis presenting with abdominal pain, nausea and vomiting. Pharmacy has been consulted to monitor and replace electrolytes.  Diet: regular MIVF: N/A Pertinent medications: N/A                          Goal of Therapy:  Electrolytes WNL   Plan:  K 3.1 --> replace with 40mEq Kcl PO, and order Mg level Check BMP + Mg in AM  Courtney Swanson ,PharmD Clinical Pharmacist 01/10/2024 7:04 AM

## 2024-01-10 NOTE — Plan of Care (Signed)
  Problem: Health Behavior/Discharge Planning: Goal: Ability to manage health-related needs will improve Outcome: Progressing   Problem: Clinical Measurements: Goal: Ability to maintain clinical measurements within normal limits will improve Outcome: Progressing Goal: Will remain free from infection Outcome: Progressing Goal: Respiratory complications will improve Outcome: Progressing Goal: Cardiovascular complication will be avoided Outcome: Progressing   Problem: Coping: Goal: Level of anxiety will decrease Outcome: Progressing   Problem: Elimination: Goal: Will not experience complications related to bowel motility Outcome: Progressing

## 2024-01-11 DIAGNOSIS — K859 Acute pancreatitis without necrosis or infection, unspecified: Secondary | ICD-10-CM | POA: Diagnosis not present

## 2024-01-11 LAB — CBC
HCT: 29.5 % — ABNORMAL LOW (ref 36.0–46.0)
Hemoglobin: 10.3 g/dL — ABNORMAL LOW (ref 12.0–15.0)
MCH: 35.6 pg — ABNORMAL HIGH (ref 26.0–34.0)
MCHC: 34.9 g/dL (ref 30.0–36.0)
MCV: 102.1 fL — ABNORMAL HIGH (ref 80.0–100.0)
Platelets: 281 K/uL (ref 150–400)
RBC: 2.89 MIL/uL — ABNORMAL LOW (ref 3.87–5.11)
RDW: 15.6 % — ABNORMAL HIGH (ref 11.5–15.5)
WBC: 7.3 K/uL (ref 4.0–10.5)
nRBC: 0 % (ref 0.0–0.2)

## 2024-01-11 LAB — BASIC METABOLIC PANEL WITH GFR
Anion gap: 9 (ref 5–15)
BUN: 7 mg/dL (ref 6–20)
CO2: 28 mmol/L (ref 22–32)
Calcium: 9.1 mg/dL (ref 8.9–10.3)
Chloride: 101 mmol/L (ref 98–111)
Creatinine, Ser: 0.46 mg/dL (ref 0.44–1.00)
GFR, Estimated: >60 mL/min (ref >60)
Glucose, Bld: 122 mg/dL — ABNORMAL HIGH (ref 70–99)
Potassium: 4 mmol/L (ref 3.5–5.1)
Sodium: 138 mmol/L (ref 135–145)

## 2024-01-11 LAB — FOLATE: Folate: 7.2 ng/mL (ref >5.9)

## 2024-01-11 LAB — MAGNESIUM: Magnesium: 1.9 mg/dL (ref 1.7–2.4)

## 2024-01-11 LAB — VITAMIN B12: Vitamin B-12: 428 pg/mL (ref 180–914)

## 2024-01-11 MED ORDER — OXYCODONE HCL 5 MG PO TABS: 5.0000 mg | ORAL_TABLET | Freq: Four times a day (QID) | ORAL | 0 refills | Status: AC | PRN

## 2024-01-11 MED ORDER — BACLOFEN 10 MG PO TABS: 10.0000 mg | ORAL_TABLET | Freq: Three times a day (TID) | ORAL | 0 refills | Status: AC | PRN

## 2024-01-11 MED ORDER — AMLODIPINE BESYLATE 5 MG PO TABS: 5.0000 mg | ORAL_TABLET | Freq: Every day | ORAL | 0 refills | Status: AC

## 2024-01-11 NOTE — Plan of Care (Signed)
  Problem: Clinical Measurements: Goal: Ability to maintain clinical measurements within normal limits will improve Outcome: Progressing   Problem: Activity: Goal: Risk for activity intolerance will decrease Outcome: Progressing   Problem: Safety: Goal: Ability to remain free from injury will improve Outcome: Progressing   Problem: Pain Managment: Goal: General experience of comfort will improve and/or be controlled Outcome: Progressing   Problem: Elimination: Goal: Will not experience complications related to bowel motility Outcome: Progressing   Problem: Coping: Goal: Level of anxiety will decrease Outcome: Progressing

## 2024-01-11 NOTE — Discharge Summary (Addendum)
 Physician Discharge Summary  Courtney Swanson FMW:[id number] DOB: 11-01-1995 DOA: 01/06/2024  PCP: [facility]  Admit date: 01/06/2024 Discharge date: 01/11/2024  Admitted From: home  Disposition:  home  Recommendations for Outpatient Follow-up:  Follow up with PCP in 1-2 weeks   Home Health: no  Equipment/Devices:  Discharge Condition: stable CODE STATUS: full  Diet recommendation: Heart Healthy  Brief/Interim Summary: HPI was taken from Dr. Cleatus: Courtney Swanson is a 28 y.o. female with medical history significant for Prior history of polysubstance abuse including alcohol and opiates, HTN, recurrent alcohol induced pancreatitis presenting with abdominal pain, nausea and vomiting.  She states she uses very little alcohol. In the ED: Hypertensive with otherwise normal vitals.  Lipase 429 with normal LFTs.  Potassium 3, urinalysis showing trace leuks and rare bacteria.  CBC 11.9 with hemoglobin 9.3 down from baseline of 10.4-11.6 with MCV 101.  EKG showing NSR at 63.  CT abdomen and pelvis consistent with acute pancreatitis. Patient treated with antiemetics, LR and given IV potassium repletion.  Started on Rocephin  for UTI. Admission requested   Discharge Diagnoses:  Principal Problem:   Acute recurrent pancreatitis Active Problems:   Urinary tract infection   Hypokalemia   Macrocytic anemia   Hypertension   Pancreatitis  Acute recurrent pancreatitis: likely secondary to continued alcohol use. Drank only a little alcohol as per pt. Continue on norco, robaxin prn. Pt requested to be completely sleep from pain meds b/c that is what she had last time she was here. Received alcohol cessation counseling. Pt requested pain meds to go home with despite her walking independently to lobby at least twice on the day prior to discharge.    R/o UTI: urine cx showed no growth so abxs were d/c    Hypokalemia: WNL today    Macrocytic anemia: likely secondary  to alcohol use. Folate is WNL. No need for a transfusion currently    HTN: continue on amlodipine    Discharge Instructions  Discharge Instructions     Diet general   Complete by: As directed    Discharge instructions   Complete by: As directed    F/u w/ PCP in 1-2 weeks   Increase activity slowly   Complete by: As directed       Allergies as of 01/11/2024       Reactions   Penicillins Other (See Comments)   Mother told me I was allergic  Mother told me I was allergic   Duloxetine Hcl Other (See Comments)   SLEEPINESS/GROGGY        Medication List     TAKE these medications    amLODipine  5 MG tablet Commonly known as: NORVASC  Take 1 tablet (5 mg total) by mouth daily.   baclofen  10 MG tablet Commonly known as: LIORESAL  Take 1 tablet (10 mg total) by mouth 3 (three) times daily as needed for up to 5 days for muscle spasms.   buprenorphine -naloxone  8-2 mg Subl SL tablet Commonly known as: SUBOXONE  Place 1 tablet under the tongue 3 (three) times daily.   EPINEPHrine  0.3 mg/0.3 mL Soaj injection Commonly known as: EPI-PEN Inject 0.3 mg into the muscle as needed for anaphylaxis.   nicotine  21 mg/24hr patch Commonly known as: NICODERM CQ  - dosed in mg/24 hours Place 1 patch (21 mg total) onto the skin daily.   omeprazole 20 MG capsule Commonly known as: PRILOSEC Take 20 mg by mouth daily.   oxyCODONE  5 MG immediate release tablet Commonly known as:  Oxy IR/ROXICODONE  Take 1 tablet (5 mg total) by mouth every 6 (six) hours as needed for up to 3 days for severe pain (pain score 7-10), breakthrough pain or moderate pain (pain score 4-6). What changed: reasons to take this   thiamine  100 MG tablet Commonly known as: Vitamin B-1 Take 1 tablet (100 mg total) by mouth daily.        Follow-up Information     [facility] Follow up.   Specialty: General Practice Why: hospital follow up Contact information: [address] [place] [address] [phone]                Allergies  Allergen Reactions   Penicillins Other (See Comments)    Mother told me I was allergic  Mother told me I was allergic   Duloxetine Hcl Other (See Comments)    SLEEPINESS/GROGGY    Consultations:    Procedures/Studies: CT ABDOMEN PELVIS W CONTRAST Result Date: 01/06/2024 CLINICAL DATA:  Hematuria, upper abdominal pain. EXAM: CT ABDOMEN AND PELVIS WITH CONTRAST TECHNIQUE: Multidetector CT imaging of the abdomen and pelvis was performed using the standard protocol following bolus administration of intravenous contrast. RADIATION DOSE REDUCTION: This exam was performed according to the departmental dose-optimization program which includes automated exposure control, adjustment of the mA and/or kV according to patient size and/or use of iterative reconstruction technique. CONTRAST:  OMNIPAQUE  IOHEXOL  300 MG/ML  SOLN COMPARISON:  10/01/2022 FINDINGS: Lower chest: Dependent atelectasis in the right lower lobe. No effusions. Hepatobiliary: Heterogeneous enhancement pattern throughout the liver with periportal edema. This can be seen with volume overload or inflammatory processes such as hepatitis. Pancreas: No focal abnormality or ductal dilatation. Edema noted surrounding the pancreas most compatible with acute pancreatitis. Spleen: No focal abnormality.  Normal size. Adrenals/Urinary Tract: No adrenal abnormality. No focal renal abnormality. No stones or hydronephrosis. Urinary bladder is unremarkable. Stomach/Bowel: Large stool burden in the rectosigmoid colon. Stomach and small bowel decompressed. No bowel obstruction or inflammatory process. Vascular/Lymphatic: No evidence of aneurysm or adenopathy. Reproductive: Uterus and adnexa unremarkable.  No mass. Other: Small amount of free fluid in the pelvis.  No free air. Musculoskeletal: No acute bony abnormality. IMPRESSION: Edema surrounding the pancreas compatible with acute  pancreatitis. Heterogeneous enhancement throughout the liver with periportal edema. This can be seen with inflammatory processes such as hepatitis. Recommend clinical correlation. Electronically Signed   By: Franky Crease M.D.   On: 01/06/2024 20:57   (Echo, Carotid, EGD, Colonoscopy, ERCP)    Subjective: Pt c/o not being happy with her care at [facility].   Discharge Exam: Vitals:   01/11/24 0405 01/11/24 0801  BP: 118/77 106/69  Pulse: 80 72  Resp: 14 16  Temp: 98.2 F (36.8 C) 98.6 F (37 C)  SpO2: 100% 100%   Vitals:   01/10/24 1628 01/10/24 1927 01/11/24 0405 01/11/24 0801  BP: 133/89 (!) 149/101 118/77 106/69  Pulse: 99 86 80 72  Resp: 18 16 14 16   Temp: 98.1 F (36.7 C) 98.2 F (36.8 C) 98.2 F (36.8 C) 98.6 F (37 C)  TempSrc:    Oral  SpO2: 100% 100% 100% 100%  Weight:      Height:        General: Pt is alert, awake, not in acute distress Cardiovascular:S1/S2 +, no rubs, no gallops Respiratory: CTA bilaterally, no wheezing, no rhonchi Abdominal: Soft, NT, ND, bowel sounds + Extremities: no edema, no cyanosis    The results of significant  diagnostics from this hospitalization (including imaging, microbiology, ancillary and laboratory) are listed below for reference.     Microbiology: Recent Results (from the past 240 hours)  Urine Culture (for pregnant, neutropenic or urologic patients or patients with an indwelling urinary catheter)     Status: None   Collection Time: 01/06/24  5:21 PM   Specimen: Urine, Clean Catch  Result Value Ref Range Status   Specimen Description   Final    URINE, CLEAN CATCH Performed at [facility], [address] [place], [place] [address]    Special Requests   Final    NONE Performed at [facility], [address], [place], [place] [address]    Culture   Final    NO GROWTH Performed at [facility], [address], [place], [place] [address]    Report Status 01/08/2024 FINAL  Final      Labs: BNP (last 3 results) No results for input(s): BNP in the last 8760 hours. Basic Metabolic Panel: Recent Labs  Lab 01/07/24 0102 01/08/24 0503 01/09/24 0451 01/10/24 0450 01/11/24 0533  NA 137 138 140 139 138  K 3.3* 3.4* 3.6 3.1* 4.0  CL 104 102 102 100 101  CO2 23 26 30 28 28   GLUCOSE 136* 124* 108* 105* 122*  BUN 6 <5* <5* <5* 7  CREATININE 0.62 0.46 0.46 0.52 0.46  CALCIUM  8.5* 8.2* 9.0 9.0 9.1  MG 1.6* 1.9 2.0  --  1.9  PHOS  --  1.6* 3.1  --   --    Liver Function Tests: Recent Labs  Lab 01/06/24 1652 01/08/24 0503 01/09/24 0451  AST 20  --   --   ALT 11  --   --   ALKPHOS 43  --   --   BILITOT 0.9  --   --   PROT 7.2  --   --   ALBUMIN 3.6 3.0* 3.1*   Recent Labs  Lab 01/06/24 1652  LIPASE 429*   No results for input(s): AMMONIA in the last 168 hours. CBC: Recent Labs  Lab 01/06/24 1652 01/07/24 0102 01/11/24 0533  WBC 11.9* 10.5 7.3  HGB 9.3* 9.2* 10.3*  HCT 26.6* 26.4* 29.5*  MCV 101.1* 101.5* 102.1*  PLT 205 185 281   Cardiac Enzymes: No results for input(s): CKTOTAL, CKMB, CKMBINDEX, TROPONINI in the last 168 hours. BNP: Invalid input(s): POCBNP CBG: No results for input(s): GLUCAP in the last 168 hours. D-Dimer No results for input(s): DDIMER in the last 72 hours. Hgb A1c No results for input(s): HGBA1C in the last 72 hours. Lipid Profile No results for input(s): CHOL, HDL, LDLCALC, TRIG, CHOLHDL, LDLDIRECT in the last 72 hours. Thyroid function studies No results for input(s): TSH, T4TOTAL, T3FREE, THYROIDAB in the last 72 hours.  Invalid input(s): FREET3 Anemia work up Recent Labs    01/11/24 0533  VITAMINB12 428  FOLATE 7.2   Urinalysis    Component Value Date/Time   COLORURINE YELLOW (A) 01/06/2024 1721   APPEARANCEUR HAZY (A) 01/06/2024 1721   APPEARANCEUR Hazy 01/26/2014 2035   LABSPEC 1.019 01/06/2024 1721   LABSPEC 1.029 01/26/2014 2035   PHURINE 6.0 01/06/2024  1721   GLUCOSEU NEGATIVE 01/06/2024 1721   GLUCOSEU Negative 01/26/2014 2035   HGBUR MODERATE (A) 01/06/2024 1721   BILIRUBINUR NEGATIVE 01/06/2024 1721   BILIRUBINUR small (A) 07/11/2021 1600   BILIRUBINUR Negative 01/26/2014 2035   KETONESUR 20 (A) 01/06/2024 1721   PROTEINUR 30 (A) 01/06/2024 1721  UROBILINOGEN 4.0 (A) 07/11/2021 1600   NITRITE NEGATIVE 01/06/2024 1721   LEUKOCYTESUR TRACE (A) 01/06/2024 1721   LEUKOCYTESUR Negative 01/26/2014 2035   Sepsis Labs Recent Labs  Lab 01/06/24 1652 01/07/24 0102 01/11/24 0533  WBC 11.9* 10.5 7.3   Microbiology Recent Results (from the past 240 hours)  Urine Culture (for pregnant, neutropenic or urologic patients or patients with an indwelling urinary catheter)     Status: None   Collection Time: 01/06/24  5:21 PM   Specimen: Urine, Clean Catch  Result Value Ref Range Status   Specimen Description   Final    URINE, CLEAN CATCH Performed at [facility], [address], [place], [place] [address]    Special Requests   Final    NONE Performed at [facility], [address] [place], [place] [address]    Culture   Final    NO GROWTH Performed at [facility], [address], [place], [place] [address]    Report Status 01/08/2024 FINAL  Final     Time coordinating discharge: 40 minutes  SIGNED:   Anthony CHRISTELLA Pouch, MD  [facility] 01/11/2024, 11:30 AM Pager   If 7PM-7AM, please contact night-coverage [email or web address]

## 2024-02-06 ENCOUNTER — Ambulatory Visit: Admitting: Physician Assistant

## 2024-02-28 ENCOUNTER — Ambulatory Visit: Admitting: Physician Assistant

## 2024-03-08 ENCOUNTER — Other Ambulatory Visit: Payer: Self-pay

## 2024-03-08 ENCOUNTER — Inpatient Hospital Stay
Admission: EM | Admit: 2024-03-08 | Discharge: 2024-03-11 | DRG: 603 | Disposition: A | Discharge status: Home Health | Attending: Internal Medicine | Admitting: Internal Medicine

## 2024-03-08 ENCOUNTER — Emergency Department

## 2024-03-08 DIAGNOSIS — D539 Nutritional anemia, unspecified: Secondary | ICD-10-CM

## 2024-03-08 DIAGNOSIS — E876 Hypokalemia: Secondary | ICD-10-CM

## 2024-03-08 DIAGNOSIS — Z8249 Family history of ischemic heart disease and other diseases of the circulatory system: Secondary | ICD-10-CM

## 2024-03-08 DIAGNOSIS — Z888 Allergy status to other drugs, medicaments and biological substances status: Secondary | ICD-10-CM

## 2024-03-08 DIAGNOSIS — Z720 Tobacco use: Secondary | ICD-10-CM

## 2024-03-08 DIAGNOSIS — Z79899 Other long term (current) drug therapy: Secondary | ICD-10-CM

## 2024-03-08 DIAGNOSIS — F1010 Alcohol abuse, uncomplicated: Secondary | ICD-10-CM | POA: Diagnosis not present

## 2024-03-08 DIAGNOSIS — L03115 Cellulitis of right lower limb: Secondary | ICD-10-CM | POA: Diagnosis not present

## 2024-03-08 DIAGNOSIS — Z79891 Long term (current) use of opiate analgesic: Secondary | ICD-10-CM

## 2024-03-08 DIAGNOSIS — J45909 Unspecified asthma, uncomplicated: Secondary | ICD-10-CM | POA: Diagnosis present

## 2024-03-08 DIAGNOSIS — I10 Essential (primary) hypertension: Secondary | ICD-10-CM | POA: Diagnosis not present

## 2024-03-08 DIAGNOSIS — F17210 Nicotine dependence, cigarettes, uncomplicated: Secondary | ICD-10-CM

## 2024-03-08 DIAGNOSIS — F1990 Other psychoactive substance use, unspecified, uncomplicated: Secondary | ICD-10-CM

## 2024-03-08 DIAGNOSIS — Z880 Allergy status to penicillin: Secondary | ICD-10-CM

## 2024-03-08 DIAGNOSIS — R7982 Elevated C-reactive protein (CRP): Secondary | ICD-10-CM | POA: Diagnosis present

## 2024-03-08 DIAGNOSIS — B954 Other streptococcus as the cause of diseases classified elsewhere: Secondary | ICD-10-CM | POA: Diagnosis present

## 2024-03-08 DIAGNOSIS — K219 Gastro-esophageal reflux disease without esophagitis: Secondary | ICD-10-CM | POA: Diagnosis present

## 2024-03-08 HISTORY — DX: Essential (primary) hypertension: I10

## 2024-03-08 HISTORY — DX: Nutritional anemia, unspecified: D53.9

## 2024-03-08 LAB — CBC WITH DIFFERENTIAL/PLATELET
Abs Immature Granulocytes: 0.03 K/uL (ref 0.00–0.07)
Basophils Absolute: 0 K/uL (ref 0.0–0.1)
Basophils Relative: 0 %
Eosinophils Absolute: 0 K/uL (ref 0.0–0.5)
Eosinophils Relative: 0 %
HCT: 28.4 % — ABNORMAL LOW (ref 36.0–46.0)
Hemoglobin: 9.8 g/dL — ABNORMAL LOW (ref 12.0–15.0)
Immature Granulocytes: 0 %
Lymphocytes Relative: 17 %
Lymphs Abs: 1.7 K/uL (ref 0.7–4.0)
MCH: 35.8 pg — ABNORMAL HIGH (ref 26.0–34.0)
MCHC: 34.5 g/dL (ref 30.0–36.0)
MCV: 103.6 fL — ABNORMAL HIGH (ref 80.0–100.0)
Monocytes Absolute: 0.5 K/uL (ref 0.1–1.0)
Monocytes Relative: 5 %
Neutro Abs: 7.7 K/uL (ref 1.7–7.7)
Neutrophils Relative %: 78 %
Platelets: 229 K/uL (ref 150–400)
RBC: 2.74 MIL/uL — ABNORMAL LOW (ref 3.87–5.11)
RDW: 17 % — ABNORMAL HIGH (ref 11.5–15.5)
WBC: 10 K/uL (ref 4.0–10.5)
nRBC: 0 % (ref 0.0–0.2)

## 2024-03-08 LAB — BASIC METABOLIC PANEL WITH GFR
Anion gap: 13 (ref 5–15)
BUN: <5 mg/dL — ABNORMAL LOW (ref 6–20)
CO2: 24 mmol/L (ref 22–32)
Calcium: 8.6 mg/dL — ABNORMAL LOW (ref 8.9–10.3)
Chloride: 97 mmol/L — ABNORMAL LOW (ref 98–111)
Creatinine, Ser: 0.47 mg/dL (ref 0.44–1.00)
GFR, Estimated: >60 mL/min (ref >60)
Glucose, Bld: 109 mg/dL — ABNORMAL HIGH (ref 70–99)
Potassium: 3.1 mmol/L — ABNORMAL LOW (ref 3.5–5.1)
Sodium: 134 mmol/L — ABNORMAL LOW (ref 135–145)

## 2024-03-08 LAB — SEDIMENTATION RATE: Sed Rate: 107 mm/h — ABNORMAL HIGH (ref 0–20)

## 2024-03-08 LAB — MAGNESIUM: Magnesium: 1.6 mg/dL — ABNORMAL LOW (ref 1.7–2.4)

## 2024-03-08 LAB — PHOSPHORUS: Phosphorus: 2.7 mg/dL (ref 2.5–4.6)

## 2024-03-08 LAB — LACTIC ACID, PLASMA: Lactic Acid, Venous: 1.7 mmol/L (ref 0.5–1.9)

## 2024-03-08 MED ORDER — SODIUM CHLORIDE 0.9 % IV SOLN
2.0000 g | Freq: Once | INTRAVENOUS | Status: AC
  Administered 2024-03-08: 2 g via INTRAVENOUS
  Filled 2024-03-08: qty 20

## 2024-03-08 MED ORDER — HYDROMORPHONE HCL 1 MG/ML IJ SOLN
1.0000 mg | Freq: Once | INTRAMUSCULAR | Status: AC
  Administered 2024-03-08: 1 mg via INTRAVENOUS
  Filled 2024-03-08: qty 1

## 2024-03-08 MED ORDER — LORAZEPAM 2 MG/ML IJ SOLN: 1.0000 mg | INTRAMUSCULAR | Status: DC | PRN

## 2024-03-08 MED ORDER — VANCOMYCIN HCL IN DEXTROSE 1-5 GM/200ML-% IV SOLN: 1000.0000 mg | Freq: Two times a day (BID) | INTRAVENOUS | Status: DC

## 2024-03-08 MED ORDER — ALBUTEROL SULFATE HFA 108 (90 BASE) MCG/ACT IN AERS: 2.0000 | INHALATION_SPRAY | RESPIRATORY_TRACT | Status: DC | PRN

## 2024-03-08 MED ORDER — POTASSIUM CHLORIDE CRYS ER 20 MEQ PO TBCR
60.0000 meq | EXTENDED_RELEASE_TABLET | Freq: Once | ORAL | Status: AC
  Administered 2024-03-08: 60 meq via ORAL
  Filled 2024-03-08: qty 3

## 2024-03-08 MED ORDER — LORAZEPAM 2 MG/ML IJ SOLN
0.0000 mg | Freq: Four times a day (QID) | INTRAMUSCULAR | Status: AC
  Administered 2024-03-09: 2 mg via INTRAVENOUS
  Administered 2024-03-09: 1 mg via INTRAVENOUS
  Administered 2024-03-10 (×2): 2 mg via INTRAVENOUS
  Administered 2024-03-10: 1 mg via INTRAVENOUS
  Filled 2024-03-08: qty 2
  Filled 2024-03-08 (×4): qty 1

## 2024-03-08 MED ORDER — SODIUM CHLORIDE 0.9 % IV SOLN: 2.0000 g | Freq: Once | INTRAVENOUS | Status: DC

## 2024-03-08 MED ORDER — ALBUTEROL SULFATE (2.5 MG/3ML) 0.083% IN NEBU: 2.5000 mg | INHALATION_SOLUTION | RESPIRATORY_TRACT | Status: DC | PRN

## 2024-03-08 MED ORDER — ADULT MULTIVITAMIN W/MINERALS CH
1.0000 | ORAL_TABLET | Freq: Every day | ORAL | Status: DC
  Administered 2024-03-09 – 2024-03-11 (×3): 1 via ORAL
  Filled 2024-03-08 (×3): qty 1

## 2024-03-08 MED ORDER — FOLIC ACID 1 MG PO TABS
1.0000 mg | ORAL_TABLET | Freq: Every day | ORAL | Status: DC
Start: 2024-03-09 — End: 2024-03-11
  Administered 2024-03-09 – 2024-03-11 (×3): 1 mg via ORAL
  Filled 2024-03-08 (×3): qty 1

## 2024-03-08 MED ORDER — VANCOMYCIN HCL IN DEXTROSE 1-5 GM/200ML-% IV SOLN
1000.0000 mg | Freq: Two times a day (BID) | INTRAVENOUS | Status: DC
  Administered 2024-03-09 – 2024-03-10 (×5): 1000 mg via INTRAVENOUS
  Filled 2024-03-08 (×6): qty 200

## 2024-03-08 MED ORDER — THIAMINE MONONITRATE 100 MG PO TABS
100.0000 mg | ORAL_TABLET | Freq: Every day | ORAL | Status: DC
  Administered 2024-03-09 – 2024-03-11 (×3): 100 mg via ORAL
  Filled 2024-03-08 (×3): qty 1

## 2024-03-08 MED ORDER — HEPARIN SODIUM (PORCINE) 5000 UNIT/ML IJ SOLN
5000.0000 [IU] | Freq: Three times a day (TID) | INTRAMUSCULAR | Status: DC
  Administered 2024-03-09 – 2024-03-10 (×5): 5000 [IU] via SUBCUTANEOUS
  Filled 2024-03-08 (×6): qty 1

## 2024-03-08 MED ORDER — THIAMINE HCL 100 MG/ML IJ SOLN
100.0000 mg | Freq: Every day | INTRAMUSCULAR | Status: DC
Start: 2024-03-09 — End: 2024-03-11

## 2024-03-08 MED ORDER — HYDRALAZINE HCL 20 MG/ML IJ SOLN: 5.0000 mg | INTRAMUSCULAR | Status: DC | PRN

## 2024-03-08 MED ORDER — NICOTINE 21 MG/24HR TD PT24
21.0000 mg | MEDICATED_PATCH | Freq: Every day | TRANSDERMAL | Status: DC
  Administered 2024-03-09 – 2024-03-11 (×4): 21 mg via TRANSDERMAL
  Filled 2024-03-08 (×4): qty 1

## 2024-03-08 MED ORDER — LORAZEPAM 1 MG PO TABS
1.0000 mg | ORAL_TABLET | ORAL | Status: DC | PRN
  Administered 2024-03-09 (×2): 2 mg via ORAL
  Administered 2024-03-10 – 2024-03-11 (×3): 1 mg via ORAL
  Filled 2024-03-08: qty 1
  Filled 2024-03-08 (×2): qty 2
  Filled 2024-03-08 (×3): qty 1

## 2024-03-08 MED ORDER — ONDANSETRON HCL 4 MG/2ML IJ SOLN: 4.0000 mg | Freq: Three times a day (TID) | INTRAMUSCULAR | Status: DC | PRN

## 2024-03-08 MED ORDER — ACETAMINOPHEN 325 MG PO TABS
650.0000 mg | ORAL_TABLET | Freq: Four times a day (QID) | ORAL | Status: DC | PRN
  Administered 2024-03-09 (×2): 650 mg via ORAL
  Filled 2024-03-08 (×3): qty 2

## 2024-03-08 MED ORDER — DM-GUAIFENESIN ER 30-600 MG PO TB12: 1.0000 | ORAL_TABLET | Freq: Two times a day (BID) | ORAL | Status: DC | PRN

## 2024-03-08 MED ORDER — AMLODIPINE BESYLATE 5 MG PO TABS
5.0000 mg | ORAL_TABLET | Freq: Every day | ORAL | Status: DC
  Administered 2024-03-09 – 2024-03-11 (×3): 5 mg via ORAL
  Filled 2024-03-08 (×3): qty 1

## 2024-03-08 MED ORDER — OXYCODONE HCL 5 MG PO TABS
10.0000 mg | ORAL_TABLET | Freq: Four times a day (QID) | ORAL | Status: DC | PRN
  Administered 2024-03-09: 10 mg via ORAL
  Filled 2024-03-08: qty 2

## 2024-03-08 MED ORDER — VANCOMYCIN HCL 1.5 G IV SOLR: 1500.0000 mg | Freq: Once | INTRAVENOUS | Status: DC

## 2024-03-08 MED ORDER — ENOXAPARIN SODIUM 40 MG/0.4ML IJ SOSY: 40.0000 mg | PREFILLED_SYRINGE | INTRAMUSCULAR | Status: DC

## 2024-03-08 MED ORDER — LORAZEPAM 2 MG/ML IJ SOLN: 0.0000 mg | Freq: Two times a day (BID) | INTRAMUSCULAR | Status: DC

## 2024-03-08 MED ORDER — SODIUM CHLORIDE 0.9 % IV BOLUS
1500.0000 mL | Freq: Once | INTRAVENOUS | Status: AC
  Administered 2024-03-09: 1500 mL via INTRAVENOUS

## 2024-03-08 MED ORDER — PANTOPRAZOLE SODIUM 40 MG PO TBEC
40.0000 mg | DELAYED_RELEASE_TABLET | Freq: Every day | ORAL | Status: DC
  Administered 2024-03-09 – 2024-03-11 (×3): 40 mg via ORAL
  Filled 2024-03-08 (×3): qty 1

## 2024-03-08 MED ORDER — SODIUM CHLORIDE 0.9 % IV SOLN
2.0000 g | INTRAVENOUS | Status: DC
  Administered 2024-03-09 – 2024-03-10 (×2): 2 g via INTRAVENOUS
  Filled 2024-03-08 (×2): qty 20

## 2024-03-08 NOTE — ED Provider Notes (Signed)
 [facility] Provider Note    Event Date/Time   First MD Initiated Contact with Patient 03/08/24 2137     (approximate)   History   Wound Check   HPI  Courtney Swanson is a 28 y.o. female with a polysubstance abuse, hypertension, and pancreatitis who presents with redness and pain to her right leg.  The patient states that she developed a small wound on her right inner ankle almost a year ago which occasionally had some redness around it.  She states that she has been on antibiotics for it several times.  However, over the last 3 days she started to have significant redness on her lower leg along with severe pain.  She has had subjective fevers, and then over the last day the redness has spread up to her knee and then to the thigh.  She feels a knot in her inner thigh.  She denies any vomiting or diarrhea.  She denies any trauma to the leg.  I reviewed the past medical records.  The patient was admitted to the hospitalist service in August with acute pancreatitis.   Physical Exam   Triage Vital Signs: ED Triage Vitals  Encounter Vitals Group     BP 03/08/24 2117 (!) 148/96     Girls Systolic BP Percentile --      Girls Diastolic BP Percentile --      Boys Systolic BP Percentile --      Boys Diastolic BP Percentile --      Pulse Rate 03/08/24 2117 (!) 102     Resp 03/08/24 2117 20     Temp 03/08/24 2117 99.7 F (37.6 C)     Temp Source 03/08/24 2117 Oral     SpO2 03/08/24 2117 100 %     Weight 03/08/24 2121 145 lb (65.8 kg)     Height 03/08/24 2121 5' 7 (1.702 m)     Head Circumference --      Peak Flow --      Pain Score 03/08/24 2121 10     Pain Loc --      Pain Education --      Exclude from Growth Chart --     Most recent vital signs: Vitals:   03/08/24 2117  BP: (!) 148/96  Pulse: (!) 102  Resp: 20  Temp: 99.7 F (37.6 C)  SpO2: 100%     General: Awake, no distress.  CV:  Good peripheral perfusion.  Resp:  Normal  effort. Abd:  No distention.  Other:  Approximately 2 cm round wound to the right medial ankle with no active drainage.  Significant erythema and induration to approximate the lower two thirds of the left lower leg.  Faint erythema and warmth going up the inner knee and thigh.   ED Results / Procedures / Treatments   Labs (all labs ordered are listed, but only abnormal results are displayed) Labs Reviewed  CBC WITH DIFFERENTIAL/PLATELET - Abnormal; Notable for the following components:      Result Value   RBC 2.74 (*)    Hemoglobin 9.8 (*)    HCT 28.4 (*)    MCV 103.6 (*)    MCH 35.8 (*)    RDW 17.0 (*)    All other components within normal limits  BASIC METABOLIC PANEL WITH GFR - Abnormal; Notable for the following components:   Sodium 134 (*)    Potassium 3.1 (*)    Chloride 97 (*)    Glucose, Bld  109 (*)    BUN <5 (*)    Calcium  8.6 (*)    All other components within normal limits  CULTURE, BLOOD (ROUTINE X 2)  CULTURE, BLOOD (ROUTINE X 2)  LACTIC ACID, PLASMA     EKG     RADIOLOGY  XR R tibia/fibula: I independently viewed and interpreted the images; there is no bony destruction to suggest osteomyelitis   PROCEDURES:  Critical Care performed: No  Procedures   MEDICATIONS ORDERED IN ED: Medications  cefTRIAXone  (ROCEPHIN ) 2 g in sodium chloride  0.9 % 100 mL IVPB (has no administration in time range)  ondansetron  (ZOFRAN ) injection 4 mg (has no administration in time range)  hydrALAZINE  (APRESOLINE ) injection 5 mg (has no administration in time range)  acetaminophen  (TYLENOL ) tablet 650 mg (has no administration in time range)  dextromethorphan-guaiFENesin  (MUCINEX  DM) 30-600 MG per 12 hr tablet 1 tablet (has no administration in time range)  albuterol  (PROVENTIL ) (2.5 MG/3ML) 0.083% nebulizer solution 2.5 mg (has no administration in time range)  HYDROmorphone  (DILAUDID ) injection 1 mg (1 mg Intravenous Given 03/08/24 2206)     IMPRESSION / MDM /  ASSESSMENT AND PLAN / ED COURSE  I reviewed the triage vital signs and the nursing notes.  28 year old female with PMH as noted above presents with a chronic wound to her right lower leg now with acute onset of redness and induration most severely in the lower leg, with erythema and warmth going up into the proximal thigh.  Differential diagnosis includes, but is not limited to, cellulitis osteomyelitis.  There is no evidence of abscess.  We obtain x-rays of the lower leg to rule out bony involvement, give IV antibiotics, obtain lab workup, and reassess.  Given the extent and rapid worsening of the likely cellulitis, I anticipate that the patient will need admission for further IV antibiotics.  Patient's presentation is most consistent with acute presentation with potential threat to life or bodily function.  The patient is on the cardiac monitor to evaluate for evidence of arrhythmia and/or significant heart rate changes.  ----------------------------------------- 11:17 PM on 03/08/2024 -----------------------------------------  X-ray shows no acute abnormality.  CBC shows no leukocytosis but the patient is somewhat anemic.  BMP is unremarkable.  I consulted Dr. Hilma from the hospitalist service; based on our discussion he agrees to evaluate the patient for admission.   FINAL CLINICAL IMPRESSION(S) / ED DIAGNOSES   Final diagnoses:  Cellulitis of right lower extremity     Rx / DC Orders   ED Discharge Orders     None        Note:  This document was prepared using Dragon voice recognition software and may include unintentional dictation errors.    Jacolyn Pae, MD 03/08/24 2318

## 2024-03-08 NOTE — H&P (Signed)
 History and Physical    Courtney Swanson FMW:[id number] DOB: 04-05-1996 DOA: 03/08/2024  Referring MD/NP/PA:   PCP: [facility]   Patient coming from:  The patient is coming from home.     Chief Complaint: right leg wound and pain  HPI: Courtney Swanson is a 28 y.o. female with medical history significant of HTN, substance use disorder, tobacco abuse, alcohol abuse, GERD, metastatic anemia, alcoholic pancreatitis, who presents with right leg wound and pain.  Pt states that she has a small chronic wound in right inner ankle area for almost 1 year. She has been on antibiotics for it several times, but it has never completely healed.  In the past 4 days, her pain has been progressively worsening.  She developed significant erythema around the wound area, which has been progressively spreading up to her upper leg, now involving the whole right leg from lower ankle to upper thigh. The pain is constant, severe, sharp, nonradiating, aggravated by movement.  She has subjective fever and chills.  Her temperature is 99.7 in ED.  Patient does not have chest pain, cough, SOB.  No nausea, vomiting, diarrhea or abdominal pain.  No symptoms of UTI. She denies any trauma to the leg.  Data reviewed independently and ED Course: pt was found to have WBC 10.0, GFR> 60, potassium 3.1, lactic acid 1.7, temperature 99.7, blood pressure 148/96, heart rate 102, RR 20, oxygen saturation 100% on room air.  Patient is pretty sedentary Benefiel patient.  X-ray of right tibia/fibula: 1. Diffuse subcutaneous edema a paddle with cellulitis. 2. No acute or destructive bony abnormality.  EKG: Not done in ED, will get one.    Review of Systems:   General: has subjective fevers, chills, no body weight gain, has fatigue HEENT: no blurry vision, hearing changes or sore throat Respiratory: no dyspnea, coughing, wheezing CV: no chest pain, no palpitations GI: no nausea, vomiting, abdominal pain,  diarrhea, constipation GU: no dysuria, burning on urination, increased urinary frequency, hematuria  Ext: has right leg pain and swelling Neuro: no unilateral weakness, numbness, or tingling, no vision change or hearing loss Skin: has a small wound in right inner ankle MSK: No muscle spasm, no deformity, no limitation of range of movement in spin Heme: No easy bruising.  Travel history: No recent long distant travel.   Allergy:  Allergies  Allergen Reactions   Penicillins Other (See Comments)    Mother told me I was allergic  Mother told me I was allergic   Duloxetine Hcl Other (See Comments)    SLEEPINESS/GROGGY    Past Medical History:  Diagnosis Date   Asthma    HTN (hypertension)    Macrocytic anemia    Pancreatitis    Substance use disorder    drug of choice oxycodone , last use 2021    Past Surgical History:  Procedure Laterality Date   NO PAST SURGERIES      Social History:  reports that she has been smoking cigarettes. She has a 2 pack-year smoking history. She has never used smokeless tobacco. She reports that she does not currently use alcohol after a past usage of about 3.0 standard drinks of alcohol per week. She reports that she does not currently use drugs after having used the following drugs: Hydrocodone .  Family History:  Family History  Problem Relation Age of Onset   Hypertension Mother    Diabetes Mother    Bipolar disorder Mother    Post-traumatic stress disorder Mother  Depression Mother      Prior to Admission medications   Medication Sig Start Date End Date Taking? Authorizing Provider  amLODipine  (NORVASC ) 5 MG tablet Take 1 tablet (5 mg total) by mouth daily. 01/11/24 02/10/24  Trudy Anthony HERO, MD  buprenorphine -naloxone  (SUBOXONE ) 8-2 mg SUBL SL tablet Place 1 tablet under the tongue 3 (three) times daily.    [provider]  EPINEPHrine  0.3 mg/0.3 mL IJ SOAJ injection Inject 0.3 mg into the muscle as needed for anaphylaxis.  11/24/20   Edelmiro Leash, MD  nicotine  (NICODERM CQ  - DOSED IN MG/24 HOURS) 21 mg/24hr patch Place 1 patch (21 mg total) onto the skin daily. 07/29/22   Gonfa, Taye T, MD  omeprazole (PRILOSEC) 20 MG capsule Take 20 mg by mouth daily.    [provider]  thiamine  (VITAMIN B-1) 100 MG tablet Take 1 tablet (100 mg total) by mouth daily. Patient not taking: Reported on 01/06/2024 10/08/22   Marsa Edelman, DO    Physical Exam: Vitals:   03/08/24 2117 03/08/24 2121  BP: (!) 148/96   Pulse: (!) 102   Resp: 20   Temp: 99.7 F (37.6 C)   TempSrc: Oral   SpO2: 100%   Weight:  65.8 kg  Height:  5' 7 (1.702 m)   General: Not in acute distress HEENT:       Eyes: PERRL, EOMI, no jaundice       ENT: No discharge from the ears and nose, no pharynx injection, no tonsillar enlargement.        Neck: No JVD, no bruit, no mass felt. Heme: No neck lymph node enlargement. Cardiac: S1/S2, RRR, No murmurs, No gallops or rubs. Respiratory: No rales, wheezing, rhonchi or rubs. GI: Soft, nondistended, nontender, no rebound pain, no organomegaly, BS present. GU: No hematuria Ext: has a small wound in the right inner ankle area, has tenderness, swelling, warmth, erythema in the whole right leg         Musculoskeletal: No joint deformities, No joint redness or warmth, no limitation of ROM in spin. Skin: No rashes.  Neuro: Alert, oriented X3, cranial nerves II-XII grossly intact, moves all extremities normally.  Psych: Patient is not psychotic, no suicidal or hemocidal ideation.  Labs on Admission: I have personally reviewed following labs and imaging studies  CBC: Recent Labs  Lab 03/08/24 2125  WBC 10.0  NEUTROABS 7.7  HGB 9.8*  HCT 28.4*  MCV 103.6*  PLT 229   Basic Metabolic Panel: Recent Labs  Lab 03/08/24 2125  NA 134*  K 3.1*  CL 97*  CO2 24  GLUCOSE 109*  BUN <5*  CREATININE 0.47  CALCIUM  8.6*  MG 1.6*  PHOS 2.7   GFR: Estimated Creatinine Clearance:  100.9 mL/min (by C-G formula based on SCr of 0.47 mg/dL). Liver Function Tests: No results for input(s): AST, ALT, ALKPHOS, BILITOT, PROT, ALBUMIN in the last 168 hours. No results for input(s): LIPASE, AMYLASE in the last 168 hours. No results for input(s): AMMONIA in the last 168 hours. Coagulation Profile: No results for input(s): INR, PROTIME in the last 168 hours. Cardiac Enzymes: No results for input(s): CKTOTAL, CKMB, CKMBINDEX, TROPONINI in the last 168 hours. BNP (last 3 results) No results for input(s): PROBNP in the last 8760 hours. HbA1C: No results for input(s): HGBA1C in the last 72 hours. CBG: No results for input(s): GLUCAP in the last 168 hours. Lipid Profile: No results for input(s): CHOL, HDL, LDLCALC, TRIG, CHOLHDL, LDLDIRECT in the last 72  hours. Thyroid Function Tests: No results for input(s): TSH, T4TOTAL, FREET4, T3FREE, THYROIDAB in the last 72 hours. Anemia Panel: No results for input(s): VITAMINB12, FOLATE, FERRITIN, TIBC, IRON, RETICCTPCT in the last 72 hours. Urine analysis:    Component Value Date/Time   COLORURINE YELLOW (A) 01/06/2024 1721   APPEARANCEUR HAZY (A) 01/06/2024 1721   APPEARANCEUR Hazy 01/26/2014 2035   LABSPEC 1.019 01/06/2024 1721   LABSPEC 1.029 01/26/2014 2035   PHURINE 6.0 01/06/2024 1721   GLUCOSEU NEGATIVE 01/06/2024 1721   GLUCOSEU Negative 01/26/2014 2035   HGBUR MODERATE (A) 01/06/2024 1721   BILIRUBINUR NEGATIVE 01/06/2024 1721   BILIRUBINUR small (A) 07/11/2021 1600   BILIRUBINUR Negative 01/26/2014 2035   KETONESUR 20 (A) 01/06/2024 1721   PROTEINUR 30 (A) 01/06/2024 1721   UROBILINOGEN 4.0 (A) 07/11/2021 1600   NITRITE NEGATIVE 01/06/2024 1721   LEUKOCYTESUR TRACE (A) 01/06/2024 1721   LEUKOCYTESUR Negative 01/26/2014 2035   Sepsis Labs: @LABRCNTIP (procalcitonin:4,lacticidven:4) )No results found for this or any previous visit (from the past 240  hours).   Radiological Exams on Admission:   Assessment/Plan Principal Problem:   Cellulitis of right lower extremity Active Problems:   HTN (hypertension)   Hypokalemia   Macrocytic anemia   Asthma   Substance use disorder   Alcohol abuse   Tobacco abuse   Assessment and Plan:  Cellulitis of right lower extremity: Patient does not have leukocytosis and her temperature is 99.7 in ED, does not meet criteria for sepsis.  Lactic acid normal.  - will place in tele bed for obs - Empiric antimicrobial treatment with vancomycin and Rocephin  - PRN Zofran  for nausea, and Tylenol , oxycodone  for pain - Blood cultures x 2  - ESR and CRP - wound care consult per RN for the small wound - IVF: 1.5 L of NS bolus - LE doppler to r/o DVT in right leg  HTN (hypertension): Blood pressure 148/96 - IV hydralazine  as needed - Continue home amlodipine   Hypokalemia: Potassium 3.1 -Replete potassium - Check phosphorus level - Check magnesium  level  Macrocytic anemia: Hemoglobin 9.8 (10.3 on 01/11/2024).  Possibly due to alcohol abuse -Follow-up with CBC  History of asthma: Stable -prn albuterol  and Mucinex   Substance use disorder, alcohol abuse and tobacco abuse: - Did counseling about importance of quitting substance use - Check UDS - Nicotine  patch - CIWA protocol        DVT ppx: SQ heparin  Code Status: Full code   Family Communication:     not done, no adult family member is at bed side.   Disposition Plan:  Anticipate discharge back to previous environment  Consults called:  none  Admission status and Level of care: Telemetry Medical:    for obs     Dispo: The patient is from: Home              Anticipated d/c is to: Home              Anticipated d/c date is: 1 day              Patient currently is not medically stable to d/c.    Severity of Illness:  The appropriate patient status for this patient is OBSERVATION. Observation status is judged to be  reasonable and necessary in order to provide the required intensity of service to ensure the patient's safety. The patient's presenting symptoms, physical exam findings, and initial radiographic and laboratory data in the context of their medical condition is felt to place  them at decreased risk for further clinical deterioration. Furthermore, it is anticipated that the patient will be medically stable for discharge from the hospital within 2 midnights of admission.        Date of Service 03/08/2024    Caleb Exon [facility]   If 7PM-7AM, please contact night-coverage [email or web address] 03/08/2024, 11:54 PM

## 2024-03-08 NOTE — ED Triage Notes (Signed)
 Patient brought by friend for wound check for R lower leg. Non healing wound x1 year, redness/pain/increased swelling starting 3 days ago. Redness traveling from wound on distal R lower leg to pelvis. Patient taking tylenol  for fever, however did not check temp at home. AxOx4 in triage, tylenol  cold and flu 1300.

## 2024-03-08 NOTE — Progress Notes (Addendum)
 Pharmacy Antibiotic Note  Courtney Swanson is a 28 y.o. female admitted on 03/08/2024 with R leg cellulitis.  Pharmacy has been consulted for Vancomycin dosing.  Plan: Vancomycin 1000 mg IV Q 12 hrs. Goal AUC 400-550. Expected AUC: 478.9 SCr used: 0.8 (10/17 SCr =0.47)  Follow up culture results to assess for antibiotic optimization. Monitor renal function to assess for any necessary antibiotic dosing changes. Pharmacy will continue to follow and will adjust abx dosing whenever warranted.  Temp (24hrs), Avg:99.7 F (37.6 C), Min:99.7 F (37.6 C), Max:99.7 F (37.6 C)   Recent Labs  Lab 03/08/24 2125 03/08/24 2138  WBC 10.0  --   CREATININE 0.47  --   LATICACIDVEN  --  1.7    Estimated Creatinine Clearance: 100.9 mL/min (by C-G formula based on SCr of 0.47 mg/dL).    Allergies  Allergen Reactions   Penicillins Other (See Comments)    Mother told me I was allergic  Mother told me I was allergic   Duloxetine Hcl Other (See Comments)    SLEEPINESS/GROGGY    Antimicrobials this admission: 10/17 Ceftriaxone  >>  10/17 Vancomycin >>   Microbiology results: 10/17 BCx: Pending  Thank you for allowing pharmacy to be a part of this patient's care.  Courtney Swanson, PharmD, [facility] 03/08/2024 11:24 PM

## 2024-03-08 NOTE — ED Notes (Signed)
 Pt wants IV site moved and doesn't want antibiotics started in current IV site. This RN said she could try and see if an IV could be started somewhere else in a minute after I got done taking care of one of my other pt's that had an urgent medical need.

## 2024-03-08 NOTE — ED Notes (Signed)
 Called lab for 2nd set of Blood Cultures. After 4 attempts to obtain 2nd set.

## 2024-03-09 ENCOUNTER — Observation Stay

## 2024-03-09 DIAGNOSIS — Z880 Allergy status to penicillin: Secondary | ICD-10-CM | POA: Diagnosis not present

## 2024-03-09 DIAGNOSIS — K219 Gastro-esophageal reflux disease without esophagitis: Secondary | ICD-10-CM | POA: Diagnosis present

## 2024-03-09 DIAGNOSIS — Z8249 Family history of ischemic heart disease and other diseases of the circulatory system: Secondary | ICD-10-CM | POA: Diagnosis not present

## 2024-03-09 DIAGNOSIS — J45909 Unspecified asthma, uncomplicated: Secondary | ICD-10-CM | POA: Diagnosis present

## 2024-03-09 DIAGNOSIS — R7982 Elevated C-reactive protein (CRP): Secondary | ICD-10-CM | POA: Diagnosis present

## 2024-03-09 DIAGNOSIS — R7881 Bacteremia: Secondary | ICD-10-CM | POA: Diagnosis not present

## 2024-03-09 DIAGNOSIS — F17210 Nicotine dependence, cigarettes, uncomplicated: Secondary | ICD-10-CM | POA: Diagnosis present

## 2024-03-09 DIAGNOSIS — Z79891 Long term (current) use of opiate analgesic: Secondary | ICD-10-CM | POA: Diagnosis not present

## 2024-03-09 DIAGNOSIS — Z79899 Other long term (current) drug therapy: Secondary | ICD-10-CM | POA: Diagnosis not present

## 2024-03-09 DIAGNOSIS — Z888 Allergy status to other drugs, medicaments and biological substances status: Secondary | ICD-10-CM | POA: Diagnosis not present

## 2024-03-09 DIAGNOSIS — D539 Nutritional anemia, unspecified: Secondary | ICD-10-CM | POA: Diagnosis present

## 2024-03-09 DIAGNOSIS — B954 Other streptococcus as the cause of diseases classified elsewhere: Secondary | ICD-10-CM | POA: Diagnosis present

## 2024-03-09 DIAGNOSIS — I10 Essential (primary) hypertension: Secondary | ICD-10-CM | POA: Diagnosis present

## 2024-03-09 DIAGNOSIS — L03115 Cellulitis of right lower limb: Secondary | ICD-10-CM | POA: Diagnosis present

## 2024-03-09 DIAGNOSIS — E876 Hypokalemia: Secondary | ICD-10-CM | POA: Diagnosis present

## 2024-03-09 DIAGNOSIS — M79604 Pain in right leg: Secondary | ICD-10-CM | POA: Diagnosis present

## 2024-03-09 DIAGNOSIS — J4520 Mild intermittent asthma, uncomplicated: Secondary | ICD-10-CM

## 2024-03-09 DIAGNOSIS — F1010 Alcohol abuse, uncomplicated: Secondary | ICD-10-CM | POA: Diagnosis present

## 2024-03-09 LAB — CBC
HCT: 28.5 % — ABNORMAL LOW (ref 36.0–46.0)
Hemoglobin: 9.5 g/dL — ABNORMAL LOW (ref 12.0–15.0)
MCH: 35.2 pg — ABNORMAL HIGH (ref 26.0–34.0)
MCHC: 33.3 g/dL (ref 30.0–36.0)
MCV: 105.6 fL — ABNORMAL HIGH (ref 80.0–100.0)
Platelets: 217 K/uL (ref 150–400)
RBC: 2.7 MIL/uL — ABNORMAL LOW (ref 3.87–5.11)
RDW: 16.9 % — ABNORMAL HIGH (ref 11.5–15.5)
WBC: 8.6 K/uL (ref 4.0–10.5)
nRBC: 0 % (ref 0.0–0.2)

## 2024-03-09 LAB — BLOOD CULTURE ID PANEL (REFLEXED) - BCID2

## 2024-03-09 LAB — BASIC METABOLIC PANEL WITH GFR
Anion gap: 12 (ref 5–15)
BUN: 5 mg/dL — ABNORMAL LOW (ref 6–20)
CO2: 24 mmol/L (ref 22–32)
Calcium: 8.2 mg/dL — ABNORMAL LOW (ref 8.9–10.3)
Chloride: 100 mmol/L (ref 98–111)
Creatinine, Ser: 0.37 mg/dL — ABNORMAL LOW (ref 0.44–1.00)
GFR, Estimated: >60 mL/min (ref >60)
Glucose, Bld: 108 mg/dL — ABNORMAL HIGH (ref 70–99)
Potassium: 3.2 mmol/L — ABNORMAL LOW (ref 3.5–5.1)
Sodium: 136 mmol/L (ref 135–145)

## 2024-03-09 LAB — URINE DRUG SCREEN, QUALITATIVE (ARMC ONLY)
Amphetamines, Ur Screen: NOT DETECTED
Barbiturates, Ur Screen: NOT DETECTED
Benzodiazepine, Ur Scrn: POSITIVE — AB
Cannabinoid 50 Ng, Ur ~~LOC~~: NOT DETECTED
Cocaine Metabolite,Ur ~~LOC~~: NOT DETECTED
MDMA (Ecstasy)Ur Screen: NOT DETECTED
Methadone Scn, Ur: NOT DETECTED
Opiate, Ur Screen: POSITIVE — AB
Phencyclidine (PCP) Ur S: NOT DETECTED
Tricyclic, Ur Screen: NOT DETECTED

## 2024-03-09 LAB — C-REACTIVE PROTEIN: CRP: 20.3 mg/dL — ABNORMAL HIGH (ref <1.0)

## 2024-03-09 LAB — PREGNANCY, URINE: Preg Test, Ur: NEGATIVE

## 2024-03-09 LAB — MAGNESIUM: Magnesium: 2.1 mg/dL (ref 1.7–2.4)

## 2024-03-09 MED ORDER — MAGNESIUM SULFATE 2 GM/50ML IV SOLN
2.0000 g | Freq: Once | INTRAVENOUS | Status: AC
  Administered 2024-03-09: 2 g via INTRAVENOUS
  Filled 2024-03-09: qty 50

## 2024-03-09 MED ORDER — OXYCODONE HCL 5 MG PO TABS
10.0000 mg | ORAL_TABLET | ORAL | Status: DC | PRN
  Administered 2024-03-09 – 2024-03-11 (×9): 10 mg via ORAL
  Filled 2024-03-09 (×9): qty 2

## 2024-03-09 MED ORDER — HYDROXYZINE HCL 10 MG PO TABS
10.0000 mg | ORAL_TABLET | Freq: Three times a day (TID) | ORAL | Status: DC | PRN
  Administered 2024-03-10: 10 mg via ORAL
  Filled 2024-03-09 (×2): qty 1

## 2024-03-09 MED ORDER — KETOROLAC TROMETHAMINE 15 MG/ML IJ SOLN
15.0000 mg | Freq: Once | INTRAMUSCULAR | Status: AC
  Administered 2024-03-09: 15 mg via INTRAVENOUS
  Filled 2024-03-09: qty 1

## 2024-03-09 MED ORDER — POTASSIUM CHLORIDE CRYS ER 20 MEQ PO TBCR
40.0000 meq | EXTENDED_RELEASE_TABLET | Freq: Once | ORAL | Status: AC
  Administered 2024-03-09: 40 meq via ORAL
  Filled 2024-03-09: qty 2

## 2024-03-09 NOTE — Assessment & Plan Note (Signed)
 Seems stable, folate and B12 checked a month ago was normal, likely secondary to alcohol use. -Continue to monitor

## 2024-03-09 NOTE — Assessment & Plan Note (Signed)
Resolved with repletion. ?

## 2024-03-09 NOTE — Progress Notes (Signed)
 PHARMACY - PHYSICIAN COMMUNICATION CRITICAL VALUE ALERT - BLOOD CULTURE IDENTIFICATION (BCID)  Courtney Swanson is an 28 y.o. female who presented to [facility] on 03/08/2024 with a chief complaint of right leg wound.  Assessment:  1/4 bottles growing GPC - identified as staph species (no resistance detected)  Name of physician (or Provider) Contacted: Dr. Caleen  Current antibiotics: ceftriaxone  2 g IV every 24 hours, vancomycin 1000 mg IV every 12 hours  Changes to prescribed antibiotics recommended:  Patient currently on recommended antibiotic (vancomycin) Per MD, will continue both vancomycin and ceftriaxone  at this time  Results for orders placed or performed during the hospital encounter of 03/08/24  Blood Culture ID Panel (Reflexed) (Collected: 03/08/2024 10:38 PM)  Result Value Ref Range   Enterococcus faecalis NOT DETECTED NOT DETECTED   Enterococcus Faecium NOT DETECTED NOT DETECTED   Listeria monocytogenes NOT DETECTED NOT DETECTED   Staphylococcus species NOT DETECTED NOT DETECTED   Staphylococcus aureus (BCID) NOT DETECTED NOT DETECTED   Staphylococcus epidermidis NOT DETECTED NOT DETECTED   Staphylococcus lugdunensis NOT DETECTED NOT DETECTED   Streptococcus species DETECTED (A) NOT DETECTED   Streptococcus agalactiae NOT DETECTED NOT DETECTED   Streptococcus pneumoniae NOT DETECTED NOT DETECTED   Streptococcus pyogenes NOT DETECTED NOT DETECTED   A.calcoaceticus-baumannii NOT DETECTED NOT DETECTED   Bacteroides fragilis NOT DETECTED NOT DETECTED   Enterobacterales NOT DETECTED NOT DETECTED   Enterobacter cloacae complex NOT DETECTED NOT DETECTED   Escherichia coli NOT DETECTED NOT DETECTED   Klebsiella aerogenes NOT DETECTED NOT DETECTED   Klebsiella oxytoca NOT DETECTED NOT DETECTED   Klebsiella pneumoniae NOT DETECTED NOT DETECTED   Proteus species NOT DETECTED NOT DETECTED   Salmonella species NOT DETECTED NOT DETECTED   Serratia marcescens NOT DETECTED NOT  DETECTED   Haemophilus influenzae NOT DETECTED NOT DETECTED   Neisseria meningitidis NOT DETECTED NOT DETECTED   Pseudomonas aeruginosa NOT DETECTED NOT DETECTED   Stenotrophomonas maltophilia NOT DETECTED NOT DETECTED   Candida albicans NOT DETECTED NOT DETECTED   Candida auris NOT DETECTED NOT DETECTED   Candida glabrata NOT DETECTED NOT DETECTED   Candida krusei NOT DETECTED NOT DETECTED   Candida parapsilosis NOT DETECTED NOT DETECTED   Candida tropicalis NOT DETECTED NOT DETECTED   Cryptococcus neoformans/gattii NOT DETECTED NOT DETECTED    Thank you for involving pharmacy in this patient's care.   Damien Napoleon, PharmD Clinical Pharmacist 03/09/2024 2:33 PM

## 2024-03-09 NOTE — Consult Note (Signed)
 WOC Nurse Consult Note: Reason for Consult: Wound type: Pressure Injury POA: Yes/No/NA Measurement: Wound bed: Drainage (amount, consistency, odor)  Periwound: Dressing procedure/placement/frequency: Cleanse RLE wound with Vashe Soila [phone]), apply hydrogel to wound bed, top with dry dressing and secure with kerlix. Change daily.    Re consult if needed, will not follow at this time. Thanks  Jennae Hakeem [employer], RN,CWOCN, CNS, [employer] [phone]

## 2024-03-09 NOTE — Hospital Course (Addendum)
 Partly taken from H&P.  Courtney Swanson is a 28 y.o. female with medical history significant of HTN, substance use disorder, tobacco abuse, alcohol abuse, GERD, metastatic anemia, alcoholic pancreatitis, who presents with right leg wound and pain.   Pt states that she has a small chronic wound in right inner ankle area for almost 1 year. She has been on antibiotics for it several times, but it has never completely healed.  In the past 4 days, her pain has been progressively worsening.  She developed significant erythema around the wound area, which has been progressively spreading up to her upper leg, now involving the whole right leg from lower ankle to upper thigh.   Presentation vitals stable, labs with WBC 10.0, potassium 3.1, magnesium  1.6, lactic acid 1.7. X-ray of right tibia and fibula with diffuse subcutaneous edema consistent with cellulitis, no acute bony abnormality.  Patient was started on Rocephin  and vancomycin.  10/18: Afebrile this morning, maximum temperature recorded off 100.5 over the past 24-hour.  Lower extremity venous Doppler was negative for DVT.  CRP elevated at 20.3.  Preliminary blood culture negative.  UDS positive for opioids and benzos which she was getting it in the hospital.  10/19: Vital stable, 1/4 blood culture bottles with Streptococcus species-no resistance identified. Can be a contaminant.  Patient to have an open wound with significant surrounding cellulitis. Echocardiogram normal and repeat blood cultures ordered.

## 2024-03-09 NOTE — Assessment & Plan Note (Signed)
Counseling was provided -Continue with CIWA protocol

## 2024-03-09 NOTE — Assessment & Plan Note (Signed)
 No acute concern. -Continue with as needed bronchodilator

## 2024-03-09 NOTE — Progress Notes (Signed)
 Progress Note   Patient: Courtney Swanson FMW:[id number] DOB: 03/09/1996 DOA: 03/08/2024     0 DOS: the patient was seen and examined on 03/09/2024   Brief hospital course: Partly taken from H&P.  Courtney Swanson is a 28 y.o. female with medical history significant of HTN, substance use disorder, tobacco abuse, alcohol abuse, GERD, metastatic anemia, alcoholic pancreatitis, who presents with right leg wound and pain.   Pt states that she has a small chronic wound in right inner ankle area for almost 1 year. She has been on antibiotics for it several times, but it has never completely healed.  In the past 4 days, her pain has been progressively worsening.  She developed significant erythema around the wound area, which has been progressively spreading up to her upper leg, now involving the whole right leg from lower ankle to upper thigh.   Presentation vitals stable, labs with WBC 10.0, potassium 3.1, magnesium  1.6, lactic acid 1.7. X-ray of right tibia and fibula with diffuse subcutaneous edema consistent with cellulitis, no acute bony abnormality.  Patient was started on Rocephin  and vancomycin.  10/18: Afebrile this morning, maximum temperature recorded off 100.5 over the past 24-hour.  Lower extremity venous Doppler was negative for DVT.  CRP elevated at 20.3.  Preliminary blood culture negative.  UDS positive for opioids and benzos which she was getting it in the hospital.  Assessment and Plan: * Cellulitis of right lower extremity Patient did not met criteria for sepsis so sepsis ruled out, still significant pain but improving edema and erythema.  Venous Doppler negative for DVT.  CRP elevated -Continue with Rocephin  and vancomycin -Continue with pain management and supportive care  HTN (hypertension) Blood pressure within goal.  - Continue with home amlodipine   Hypokalemia Improving, potassium at 3.2 today. - Replace potassium and monitor  Hypomagnesemia Resolved with  repletion.  Macrocytic anemia Seems stable, folate and B12 checked a month ago was normal, likely secondary to alcohol use. -Continue to monitor  Asthma No acute concern. -Continue with as needed bronchodilator  Alcohol abuse Counseling was provided. - Continue with CIWA protocol  Tobacco abuse Counseling was provided. - Nicotine  patch as needed  Substance use disorder Denies any ongoing illicit drug use. UDS positive for opioid and benzos which she was getting it in the hospital.   Subjective: Patient was complaining of persistent right leg pain.  Denies any illicit drug use.  Physical Exam: Vitals:   03/09/24 0056 03/09/24 0605 03/09/24 0805 03/09/24 0852  BP: (!) 116/96  123/84 112/82  Pulse: 99  93 91  Resp: 17  16   Temp: (!) 100.4 F (38 C) (!) 100.5 F (38.1 C) 99.9 F (37.7 C)   TempSrc:      SpO2: 100%  100%   Weight:      Height:       General.  Well-developed lady, in no acute distress. Pulmonary.  Lungs clear bilaterally, normal respiratory effort. CV.  Regular rate and rhythm, no JVD, rub or murmur. Abdomen.  Soft, nontender, nondistended, BS positive. CNS.  Alert and oriented .  No focal neurologic deficit. Extremities.  Right leg with erythema and edema, small wound on medial side of ankle with a clean base and no discharge Psychiatry.  Judgment and insight appears normal.   Data Reviewed: Prior data reviewed  Family Communication: Discussed with patient  Disposition: Status is: Observation The patient will require care spanning > 2 midnights and should be moved to inpatient because: Severity of  illness  Planned Discharge Destination: Home  DVT prophylaxis.  Subcu heparin Time spent: 50 minutes  This record has been created using Conservation officer, historic buildings. Errors have been sought and corrected,but may not always be located. Such creation errors do not reflect on the standard of care.   Author: Amaryllis Dare, MD 03/09/2024 12:26  PM  For on call review [email or web address].

## 2024-03-09 NOTE — Assessment & Plan Note (Signed)
 Improving, potassium at 3.2 today. - Replace potassium and monitor

## 2024-03-09 NOTE — Plan of Care (Signed)

## 2024-03-09 NOTE — ED Notes (Signed)
 PT waiting on door dash delivery before she can be transported to her inpatient room.

## 2024-03-09 NOTE — Assessment & Plan Note (Signed)
 Patient did not met criteria for sepsis so sepsis ruled out, still significant pain but improving edema and erythema.  Venous Doppler negative for DVT.  CRP elevated -Continue with Rocephin  and vancomycin -Continue with pain management and supportive care

## 2024-03-09 NOTE — Assessment & Plan Note (Signed)
 Blood pressure within goal. - Continue with home amlodipine 

## 2024-03-09 NOTE — Assessment & Plan Note (Signed)
Counseling was provided. -Nicotine patch as needed 

## 2024-03-09 NOTE — Assessment & Plan Note (Signed)
 Denies any ongoing illicit drug use. UDS positive for opioid and benzos which she was getting it in the hospital.

## 2024-03-09 NOTE — Progress Notes (Signed)
 PT Cancellation Note  Patient Details Name: Courtney Swanson MRN: [id number] DOB: 1996-02-07   Cancelled Treatment:    Reason Eval/Treat Not Completed: Patient declined, no reason specified. PT orders received and pt chart reviewed. Pt supine in bed asleep upon entry; awakens with verbal and tactile stimuli. Declining participation with PT/OT at this time due to 9/10 pain in RLE and fatigue. Agreeable for participation with therapy evaluations tomorrow. Will follow up as appropriate.   Camie CHARLENA Kluver, PT, DPT 2:42 PM,03/09/24 Physical Therapist - [facility]

## 2024-03-10 ENCOUNTER — Inpatient Hospital Stay (HOSPITAL_COMMUNITY)
Admit: 2024-03-10 | Discharge: 2024-03-10 | Disposition: A | Discharge status: Home / Self Care | Attending: Internal Medicine | Admitting: Internal Medicine

## 2024-03-10 DIAGNOSIS — R7881 Bacteremia: Secondary | ICD-10-CM

## 2024-03-10 DIAGNOSIS — E876 Hypokalemia: Secondary | ICD-10-CM | POA: Diagnosis not present

## 2024-03-10 DIAGNOSIS — L03115 Cellulitis of right lower limb: Secondary | ICD-10-CM | POA: Diagnosis not present

## 2024-03-10 DIAGNOSIS — I10 Essential (primary) hypertension: Secondary | ICD-10-CM | POA: Diagnosis not present

## 2024-03-10 LAB — ECHOCARDIOGRAM COMPLETE
AR max vel: 2.91 cm2
AV Peak grad: 8.2 mmHg
Ao pk vel: 1.43 m/s
Area-P 1/2: 4.06 cm2
Height: 67 in
S' Lateral: 3.9 cm
Weight: 2320 [oz_av]

## 2024-03-10 MED ORDER — KETOROLAC TROMETHAMINE 30 MG/ML IJ SOLN
30.0000 mg | Freq: Once | INTRAMUSCULAR | Status: AC
  Administered 2024-03-10: 30 mg via INTRAVENOUS
  Filled 2024-03-10: qty 1

## 2024-03-10 NOTE — Plan of Care (Signed)

## 2024-03-10 NOTE — Assessment & Plan Note (Signed)
 Patient did not met criteria for sepsis so sepsis ruled out, still significant pain but improving edema and erythema.  Venous Doppler negative for DVT.  CRP elevated 1/4 blood cultures with strep species, no identification or resistance. Echocardiogram normal Repeat blood cultures ordered -Continue with Rocephin  and vancomycin -Continue with pain management and supportive care

## 2024-03-10 NOTE — Assessment & Plan Note (Signed)
 Blood pressure within goal. - Continue with home amlodipine 

## 2024-03-10 NOTE — Evaluation (Signed)
 Physical Therapy Evaluation Patient Details Name: Courtney Swanson MRN: [id number] DOB: May 21, 1996 Today's Date: 03/10/2024  History of Present Illness  Courtney Swanson is a 28 y.o. female with medical history significant of HTN, substance use disorder, tobacco abuse, alcohol abuse, GERD, metastatic anemia, alcoholic pancreatitis, who presents with right leg wound and pain.  Clinical Impression  Patient received in bed. Family at bedside who lives in [place]. Patient is reluctant to attempt mobility, but agrees with encouragement. Patient is mod I with bed mobility. Increased time and effort due to pain in R LE. Patient attempted to stand with RW and min A. Was unable to weight bear on R LE due to pain. RN states patient had pain medicine approx 1 hour prior. Patient will continue to benefit from skilled PT to improve independence and safety with mobility.             If plan is discharge home, recommend the following: A lot of help with walking and/or transfers;A lot of help with bathing/dressing/bathroom   Can travel by private vehicle   No    Equipment Recommendations BSC/3in1  Recommendations for Other Services       Functional Status Assessment Patient has had a recent decline in their functional status and demonstrates the ability to make significant improvements in function in a reasonable and predictable amount of time.     Precautions / Restrictions Precautions Precautions: Fall Recall of Precautions/Restrictions: Intact Restrictions Weight Bearing Restrictions Per Provider Order: No      Mobility  Bed Mobility Overal bed mobility: Modified Independent                  Transfers Overall transfer level: Needs assistance Equipment used: Rolling walker (2 wheels) Transfers: Sit to/from Stand Sit to Stand: Min assist           General transfer comment: unable to put pressure on R LE to stand.    Ambulation/Gait               General Gait  Details: unable  Stairs            Wheelchair Mobility     Tilt Bed    Modified Rankin (Stroke Patients Only)       Balance Overall balance assessment: Needs assistance Sitting-balance support: Feet supported Sitting balance-Leahy Scale: Good                                       Pertinent Vitals/Pain Pain Assessment Pain Assessment: Faces Faces Pain Scale: Hurts whole lot Pain Location: R LE Pain Descriptors / Indicators: Discomfort, Grimacing, Sore Pain Intervention(s): Monitored during session, Premedicated before session    Home Living Family/patient expects to be discharged to:: Private residence Living Arrangements: Children (she has 68 year old twins)   Type of Home: House Home Access: Ramped entrance       Home Layout: One level Home Equipment: Agricultural consultant (2 wheels);Wheelchair - manual      Prior Function Prior Level of Function : Needs assist       Physical Assist : Mobility (physical) Mobility (physical): Gait   Mobility Comments: patient has been having difficulty for a month with pain in R LE       Extremity/Trunk Assessment   Upper Extremity Assessment Upper Extremity Assessment: Defer to OT evaluation    Lower Extremity Assessment Lower Extremity Assessment: RLE deficits/detail RLE Deficits /  Details: Very painful R LE, unable to weight bear. Had pain medicine ~1 hour prior. RLE: Unable to fully assess due to pain RLE Coordination: decreased gross motor    Cervical / Trunk Assessment Cervical / Trunk Assessment: Normal  Communication   Communication Communication: No apparent difficulties    Cognition Arousal: Alert Behavior During Therapy: WFL for tasks assessed/performed, Agitated   PT - Cognitive impairments: No apparent impairments                         Following commands: Intact       Cueing Cueing Techniques: Verbal cues     General Comments      Exercises      Assessment/Plan    PT Assessment Patient needs continued PT services  PT Problem List Decreased strength;Decreased activity tolerance;Decreased balance;Decreased mobility;Pain;Decreased skin integrity;Decreased knowledge of use of DME       PT Treatment Interventions DME instruction;Gait training;Stair training;Functional mobility training;Therapeutic activities;Therapeutic exercise;Balance training;Patient/family education    PT Goals (Current goals can be found in the Care Plan section)  Acute Rehab PT Goals Patient Stated Goal: improve pain PT Goal Formulation: With patient Time For Goal Achievement: 03/22/24 Potential to Achieve Goals: Good    Frequency Min 3X/week     Co-evaluation               AM-PAC PT 6 Clicks Mobility  Outcome Measure Help needed turning from your back to your side while in a flat bed without using bedrails?: None Help needed moving from lying on your back to sitting on the side of a flat bed without using bedrails?: None Help needed moving to and from a bed to a chair (including a wheelchair)?: A Lot Help needed standing up from a chair using your arms (e.g., wheelchair or bedside chair)?: Total Help needed to walk in hospital room?: Total Help needed climbing 3-5 steps with a railing? : Total 6 Click Score: 13    End of Session   Activity Tolerance: Patient limited by pain Patient left: in bed;with call bell/phone within reach;with family/visitor present Nurse Communication: Mobility status PT Visit Diagnosis: Other abnormalities of gait and mobility (R26.89);Pain;Difficulty in walking, not elsewhere classified (R26.2) Pain - Right/Left: Right Pain - part of body: Leg    Time: 8955-8944 PT Time Calculation (min) (ACUTE ONLY): 11 min   Charges:   PT Evaluation $PT Eval Moderate Complexity: 1 Mod   PT General Charges $$ ACUTE PT VISIT: 1 Visit        Laniesha Das, PT, GCS 03/10/24,11:04 AM

## 2024-03-10 NOTE — Progress Notes (Signed)
 Echocardiogram 2D Echocardiogram has been performed.  Mateusz Neilan N Ulysses Alper,RDCS 03/10/2024, 12:23 PM

## 2024-03-10 NOTE — Progress Notes (Signed)
 Progress Note   Patient: Courtney Swanson FMW:[id number] DOB: October 28, 1995 DOA: 03/08/2024     1 DOS: the patient was seen and examined on 03/10/2024   Brief hospital course: Partly taken from H&P.  Courtney Swanson is a 28 y.o. female with medical history significant of HTN, substance use disorder, tobacco abuse, alcohol abuse, GERD, metastatic anemia, alcoholic pancreatitis, who presents with right leg wound and pain.   Pt states that she has a small chronic wound in right inner ankle area for almost 1 year. She has been on antibiotics for it several times, but it has never completely healed.  In the past 4 days, her pain has been progressively worsening.  She developed significant erythema around the wound area, which has been progressively spreading up to her upper leg, now involving the whole right leg from lower ankle to upper thigh.   Presentation vitals stable, labs with WBC 10.0, potassium 3.1, magnesium  1.6, lactic acid 1.7. X-ray of right tibia and fibula with diffuse subcutaneous edema consistent with cellulitis, no acute bony abnormality.  Patient was started on Rocephin  and vancomycin.  10/18: Afebrile this morning, maximum temperature recorded off 100.5 over the past 24-hour.  Lower extremity venous Doppler was negative for DVT.  CRP elevated at 20.3.  Preliminary blood culture negative.  UDS positive for opioids and benzos which she was getting it in the hospital.  10/19: Vital stable, 1/4 blood culture bottles with Streptococcus species-no resistance identified. Can be a contaminant.  Patient to have an open wound with significant surrounding cellulitis. Echocardiogram normal and repeat blood cultures ordered.  Assessment and Plan: * Cellulitis of right lower extremity Patient did not met criteria for sepsis so sepsis ruled out, still significant pain but improving edema and erythema.  Venous Doppler negative for DVT.  CRP elevated 1/4 blood cultures with strep species, no  identification or resistance. Echocardiogram normal Repeat blood cultures ordered -Continue with Rocephin  and vancomycin -Continue with pain management and supportive care  HTN (hypertension) Blood pressure within goal.  - Continue with home amlodipine   Hypokalemia Improving,  - Replace potassium as needed and monitor  Hypomagnesemia Resolved with repletion.  Macrocytic anemia Seems stable, folate and B12 checked a month ago was normal, likely secondary to alcohol use. -Continue to monitor  Asthma No acute concern. -Continue with as needed bronchodilator  Alcohol abuse Counseling was provided. - Continue with CIWA protocol  Tobacco abuse Counseling was provided. - Nicotine  patch as needed  Substance use disorder Denies any ongoing illicit drug use. UDS positive for opioid and benzos which she was getting it in the hospital.   Subjective: Patient continued to complain about right leg pain.  Edema and erythema improving.  Physical Exam: Vitals:   03/10/24 0129 03/10/24 0438 03/10/24 0927 03/10/24 1112  BP: 110/72 (!) 123/104 120/81 (!) 130/91  Pulse: 88 86 87 (!) 109  Resp:  19 16   Temp:  99.1 F (37.3 C) 98.8 F (37.1 C)   TempSrc:   Oral   SpO2:  100% 100%   Weight:      Height:       General. Well-developed lady, in no acute distress. Pulmonary.  Lungs clear bilaterally, normal respiratory effort. CV.  Regular rate and rhythm, no JVD, rub or murmur. Abdomen.  Soft, nontender, nondistended, BS positive. CNS.  Alert and oriented .  No focal neurologic deficit. Extremities.  Improving edema and erythema of right leg Psychiatry.  Judgment and insight appears normal.   Data Reviewed:  Prior data reviewed  Family Communication: Discussed with patient  Disposition: Status is: Inpatient  inpatient because: Severity of illness  Planned Discharge Destination: Home  DVT prophylaxis.  Subcu heparin Time spent: 50 minutes  This record has been created  using Conservation officer, historic buildings. Errors have been sought and corrected,but may not always be located. Such creation errors do not reflect on the standard of care.   Author: Amaryllis Dare, MD 03/10/2024 3:32 PM  For on call review [email or web address].

## 2024-03-10 NOTE — Assessment & Plan Note (Signed)
 Improving,  - Replace potassium as needed and monitor

## 2024-03-11 ENCOUNTER — Other Ambulatory Visit: Payer: Self-pay

## 2024-03-11 DIAGNOSIS — E876 Hypokalemia: Secondary | ICD-10-CM | POA: Diagnosis not present

## 2024-03-11 DIAGNOSIS — I10 Essential (primary) hypertension: Secondary | ICD-10-CM | POA: Diagnosis not present

## 2024-03-11 DIAGNOSIS — L03115 Cellulitis of right lower limb: Secondary | ICD-10-CM | POA: Diagnosis not present

## 2024-03-11 LAB — BASIC METABOLIC PANEL WITH GFR
Anion gap: 12 (ref 5–15)
BUN: <5 mg/dL — ABNORMAL LOW (ref 6–20)
CO2: 24 mmol/L (ref 22–32)
Calcium: 8.5 mg/dL — ABNORMAL LOW (ref 8.9–10.3)
Chloride: 105 mmol/L (ref 98–111)
Creatinine, Ser: <0.3 mg/dL — ABNORMAL LOW (ref 0.44–1.00)
Glucose, Bld: 107 mg/dL — ABNORMAL HIGH (ref 70–99)
Potassium: 3.4 mmol/L — ABNORMAL LOW (ref 3.5–5.1)
Sodium: 141 mmol/L (ref 135–145)

## 2024-03-11 LAB — CULTURE, BLOOD (ROUTINE X 2): Special Requests: ADEQUATE

## 2024-03-11 LAB — CBC
HCT: 27 % — ABNORMAL LOW (ref 36.0–46.0)
Hemoglobin: 9 g/dL — ABNORMAL LOW (ref 12.0–15.0)
MCH: 35.9 pg — ABNORMAL HIGH (ref 26.0–34.0)
MCHC: 33.3 g/dL (ref 30.0–36.0)
MCV: 107.6 fL — ABNORMAL HIGH (ref 80.0–100.0)
Platelets: 321 K/uL (ref 150–400)
RBC: 2.51 MIL/uL — ABNORMAL LOW (ref 3.87–5.11)
RDW: 16.6 % — ABNORMAL HIGH (ref 11.5–15.5)
WBC: 8.3 K/uL (ref 4.0–10.5)
nRBC: 0 % (ref 0.0–0.2)

## 2024-03-11 MED ORDER — FE FUM-VIT C-VIT B12-FA 460-60-0.01-1 MG PO CAPS: 1.0000 | ORAL_CAPSULE | Freq: Every day | ORAL | Status: DC

## 2024-03-11 MED ORDER — ADULT MULTIVITAMIN W/MINERALS CH
1.0000 | ORAL_TABLET | Freq: Every day | ORAL | 1 refills | Status: AC
  Filled 2024-03-11: qty 90, 90d supply, fill #0

## 2024-03-11 MED ORDER — HYDROXYZINE HCL 10 MG PO TABS
10.0000 mg | ORAL_TABLET | Freq: Three times a day (TID) | ORAL | 0 refills | Status: AC | PRN
  Filled 2024-03-11: qty 30, 10d supply, fill #0

## 2024-03-11 MED ORDER — POLYSACCHARIDE IRON COMPLEX 150 MG PO CAPS
150.0000 mg | ORAL_CAPSULE | Freq: Every day | ORAL | 0 refills | Status: AC
  Filled 2024-03-11 (×2): qty 90, 90d supply, fill #0

## 2024-03-11 MED ORDER — NICOTINE 21 MG/24HR TD PT24
21.0000 mg | MEDICATED_PATCH | Freq: Every day | TRANSDERMAL | 0 refills | Status: AC
  Filled 2024-03-11: qty 28, 28d supply, fill #0

## 2024-03-11 MED ORDER — VITAMIN B-1 100 MG PO TABS
100.0000 mg | ORAL_TABLET | Freq: Every day | ORAL | 1 refills | Status: AC
  Filled 2024-03-11: qty 90, 90d supply, fill #0

## 2024-03-11 MED ORDER — CEFADROXIL 500 MG PO CAPS
1000.0000 mg | ORAL_CAPSULE | Freq: Two times a day (BID) | ORAL | 0 refills | Status: AC
  Filled 2024-03-11: qty 40, 10d supply, fill #0

## 2024-03-11 MED ORDER — CEFADROXIL 500 MG PO CAPS
1000.0000 mg | ORAL_CAPSULE | Freq: Two times a day (BID) | ORAL | Status: DC
  Filled 2024-03-11: qty 2

## 2024-03-11 MED ORDER — OXYCODONE HCL 10 MG PO TABS
10.0000 mg | ORAL_TABLET | ORAL | 0 refills | Status: AC | PRN
  Filled 2024-03-11: qty 30, 5d supply, fill #0

## 2024-03-11 NOTE — TOC Transition Note (Signed)
 Transition of Care [facility]) - Discharge Note   Patient Details  Name: Courtney Swanson MRN: [id number] Date of Birth: June 14, 1995  [facility] CM/SW Contact:  Alvaro Louder, LCSW Phone Number: 03/11/2024, 3:42 PM   Clinical Narrative:   LCSWA confirmed with MD that patient is stable for discharge. LCSWA notified the patient and they are in agreement with discharge. LCSWA discussed PT recommendation of HH Patient was agreeable. LCSWA reached out to [facility] admissions coordinator an started service for patient.  TOC signing off    Final next level of care: Home w Home Health Services Barriers to Discharge: No Barriers Identified   Patient Goals and CMS Choice            Discharge Placement              Patient chooses bed at:  (Home) Patient to be transferred to facility by: Friend Name of family member notified: Self Patient and family notified of of transfer: 03/11/24  Discharge Plan and Services Additional resources added to the After Visit Summary for                            [facility] Arranged: PT, OT [facility] Agency: [facility] Date [facility] Agency Contacted: 03/11/24   Representative spoke with at [facility] Agency: [place]  Social Drivers of Health (SDOH) Interventions SDOH Screenings   Food Insecurity: Food Insecurity Present (03/09/2024)  Housing: Low Risk  (03/11/2024)  Transportation Needs: Unmet Transportation Needs (03/09/2024)  Utilities: Not At Risk (03/09/2024)  Depression (PHQ2-9): Low Risk  (07/28/2021)  Tobacco Use: High Risk (03/08/2024)     Readmission Risk Interventions     No data to display

## 2024-03-11 NOTE — Evaluation (Signed)
 Occupational Therapy Evaluation Patient Details Name: Courtney Swanson MRN: [id number] DOB: 11/30/1995 Today's Date: 03/11/2024   History of Present Illness   Courtney Swanson is a 28 y.o. female with medical history significant of HTN, substance use disorder, tobacco abuse, alcohol abuse, GERD, metastatic anemia, alcoholic pancreatitis, who presents with right leg wound and pain.     Clinical Impressions Patient was seen for OT evaluation this date. Prior to hospital admission, patient was caring for her 29 year old twins, but limited by pain, no one was assisting her with any tasks. Patient lives in single story home with extended family support. Patient has been hospitalized due to cellulitis to RLE. Patient refused OT this morning, OT attempted again later today, she continues to report 9/10 pain, MD/RN notified via secure chat. Patient reports she is going home today, she is apprehensive about going home. She is agreeable to therapy. She transitioned to EOB without A, she performed sit<>stand with r/w without A, she was only able to ambulate 3-4 feet before reporting pain was now 10/10, she returned to bed without A. .  Patient presents with deficits in activity tolerance, pain tolerance, standing tolerance, affecting safe and optimal ADL completion. Patient is currently requiring no physical A to manage ADLs, can manage all with set up A.  Paient would benefit from skilled OT services to address noted impairments and functional limitations (see below for any additional details) in order to maximize safety and independence while minimizing future risk of falls, injury, and readmission. Anticipate the need for follow up OT services upon acute hospital DC.      If plan is discharge home, recommend the following:   A little help with walking and/or transfers;A little help with bathing/dressing/bathroom;Direct supervision/assist for medications management     Functional Status Assessment    Patient has had a recent decline in their functional status and demonstrates the ability to make significant improvements in function in a reasonable and predictable amount of time.     Equipment Recommendations   BSC/3in1     Recommendations for Other Services         Precautions/Restrictions   Precautions Precautions: Fall Recall of Precautions/Restrictions: Intact Restrictions Weight Bearing Restrictions Per Provider Order: No     Mobility Bed Mobility Overal bed mobility: Modified Independent                  Transfers Overall transfer level: Modified independent Equipment used: Rolling walker (2 wheels), None   Sit to Stand: Contact guard assist                  Balance Overall balance assessment: Needs assistance Sitting-balance support: Feet supported Sitting balance-Leahy Scale: Normal     Standing balance support: Bilateral upper extremity supported Standing balance-Leahy Scale: Fair Standing balance comment: heavy reliance on walker                           ADL either performed or assessed with clinical judgement   ADL Overall ADL's : Needs assistance/impaired     Grooming: Sitting;Set up   Upper Body Bathing: Set up;Sitting   Lower Body Bathing: Set up;Sitting/lateral leans;Sit to/from stand   Upper Body Dressing : Set up;Sitting   Lower Body Dressing: Minimal assistance;Sit to/from stand   Toilet Transfer: Moderate assistance                   Vision  Perception         Praxis         Pertinent Vitals/Pain       Extremity/Trunk Assessment Upper Extremity Assessment Upper Extremity Assessment: Overall WFL for tasks assessed           Communication Communication Communication: No apparent difficulties   Cognition Arousal: Alert Behavior During Therapy: WFL for tasks assessed/performed Cognition: No apparent impairments                               Following  commands: Intact       Cueing  General Comments   Cueing Techniques: Verbal cues      Exercises     Shoulder Instructions      Home Living Family/patient expects to be discharged to:: Private residence Living Arrangements: Other relatives Available Help at Discharge: Family Type of Home: House Home Access: Ramped entrance     Home Layout: One level     Bathroom Shower/Tub: Estate manager/land agent Accessibility: Yes   Home Equipment: Wheelchair - manual          Prior Functioning/Environment Prior Level of Function : Independent/Modified Independent       Physical Assist : Mobility (physical) Mobility (physical): Gait   Mobility Comments: patient has been having difficulty for a month with pain in R LE ADLs Comments: mostly limited by pain but able to do all tasks on her own    OT Problem List: Decreased strength;Decreased activity tolerance;Decreased safety awareness;Pain   OT Treatment/Interventions: Self-care/ADL training;Therapeutic exercise;Energy conservation;Therapeutic activities      OT Goals(Current goals can be found in the care plan section)   Acute Rehab OT Goals Patient Stated Goal: to get my pain down OT Goal Formulation: With patient Time For Goal Achievement: 03/25/24 Potential to Achieve Goals: Fair ADL Goals Pt Will Perform Grooming: with modified independence;sitting;standing Pt Will Perform Lower Body Dressing: with modified independence;sit to/from stand;bed level Pt Will Transfer to Toilet: with modified independence   OT Frequency:  Min 2X/week    Co-evaluation              AM-PAC OT 6 Clicks Daily Activity     Outcome Measure Help from another person eating meals?: None Help from another person taking care of personal grooming?: A Little Help from another person toileting, which includes using toliet, bedpan, or urinal?: A Little Help from another person bathing (including washing, rinsing, drying)?: A  Little Help from another person to put on and taking off regular upper body clothing?: A Little Help from another person to put on and taking off regular lower body clothing?: A Little 6 Click Score: 19   End of Session Equipment Utilized During Treatment: Rolling walker (2 wheels) Nurse Communication: Patient requests pain meds  Activity Tolerance: Patient limited by pain Patient left: in bed;with call bell/phone within reach  OT Visit Diagnosis: Unsteadiness on feet (R26.81);Muscle weakness (generalized) (M62.81);Pain Pain - Right/Left: Right Pain - part of body: Leg                Time: 8687-8668 OT Time Calculation (min): 19 min Charges:  OT General Charges $OT Visit: 1 Visit OT Evaluation $OT Eval Low Complexity: 1 Low OT Treatments $Self Care/Home Management : 8-22 mins  Rogers Clause, OT/L MSOT, 03/11/2024

## 2024-03-11 NOTE — Progress Notes (Signed)
 Patient left the unit twice after given education on the risk to herself and safety concerns. . Patient was trying to leave to smoke.   Security was called and patient was educated and verbalize understanding.

## 2024-03-11 NOTE — Discharge Summary (Signed)
 Physician Discharge Summary   Patient: Courtney Swanson MRN: [id number] DOB: 10/29/1995  Admit date:     03/08/2024  Discharge date: 03/11/24  Discharge Physician: Amaryllis Dare   PCP: [facility]   Recommendations at discharge:  Please obtain CBC and BMP and follow-up Follow-up with primary care provider within a week Please ensure completion of antibiotics Please follow-up final blood culture results  Discharge Diagnoses: Principal Problem:   Cellulitis of right lower extremity Active Problems:   HTN (hypertension)   Hypokalemia   Hypomagnesemia   Macrocytic anemia   Asthma   Alcohol abuse   Tobacco abuse   Substance use disorder   Hospital Course: Partly taken from H&P.  Courtney Swanson is a 28 y.o. female with medical history significant of HTN, substance use disorder, tobacco abuse, alcohol abuse, GERD, anemia, alcoholic pancreatitis, who presents with right leg wound and pain.   Pt states that she has a small chronic wound in right inner ankle area for almost 1 year. She has been on antibiotics for it several times, but it has never completely healed.  In the past 4 days, her pain has been progressively worsening.  She developed significant erythema around the wound area, which has been progressively spreading up to her upper leg, now involving the whole right leg from lower ankle to upper thigh.   Presentation vitals stable, labs with WBC 10.0, potassium 3.1, magnesium  1.6, lactic acid 1.7. X-ray of right tibia and fibula with diffuse subcutaneous edema consistent with cellulitis, no acute bony abnormality.  Patient was started on Rocephin  and vancomycin.  10/18: Afebrile this morning, maximum temperature recorded off 100.5 over the past 24-hour.  Lower extremity venous Doppler was negative for DVT.  CRP elevated at 20.3.  Preliminary blood culture negative.  UDS positive for opioids and benzos which she was getting it in the  hospital.  10/19: Vital stable, 1/4 blood culture bottles with Streptococcus species-no resistance identified. Can be a contaminant.  Patient to have an open wound with significant surrounding cellulitis. Echocardiogram normal and repeat blood cultures ordered.  10/20: Remained hemodynamically stable, repeat blood cultures negative in 24-hour.  Case was discussed with ID and there is no need to do TEE. Cellulitis clinically seems improving although still having some pain.  PT recommending home health which was ordered.  Antibiotics switched with cefadroxil for 10 more days.  Patient would like to go home as he has younger kids.  She is being discharged with home health services and need to have a close follow-up with her providers for further assistance.  Patient is also on Suboxone  which she should hold until taking oxycodone  for pain.  Assessment and Plan: * Cellulitis of right lower extremity Patient did not met criteria for sepsis so sepsis ruled out, still significant pain but improving edema and erythema.  Venous Doppler negative for DVT.  CRP elevated 1/4 blood cultures with strep species, no identification or resistance. Echocardiogram normal Repeat blood cultures negative in 24-hour Antibiotics switched with cefadroxil for 10 more days  HTN (hypertension) Blood pressure within goal.  - Continue with home amlodipine   Hypokalemia Improving,  - Replace potassium as needed and monitor  Hypomagnesemia Resolved with repletion.  Macrocytic anemia Seems stable, folate and B12 checked a month ago was normal, likely secondary to alcohol use. -Continue to monitor  Asthma No acute concern. -Continue with as needed bronchodilator  Alcohol abuse Counseling was provided. - Continue with CIWA protocol  Tobacco abuse Counseling was  provided. - Nicotine  patch as needed  Substance use disorder Denies any ongoing illicit drug use. UDS positive for opioid and benzos which she  was getting it in the hospital.   Pain control -   Controlled Substance Reporting System database was reviewed. and patient was instructed, not to drive, operate heavy machinery, perform activities at heights, swimming or participation in water activities or provide baby-sitting services while on Pain, Sleep and Anxiety Medications; until their outpatient Physician has advised to do so again. Also recommended to not to take more than prescribed Pain, Sleep and Anxiety Medications.  Consultants: None Procedures performed: None Disposition: Home health Diet recommendation:  Discharge Diet Orders (From admission, onward)     Start     Ordered   03/11/24 0000  Diet - low sodium heart healthy        03/11/24 1228           Regular diet DISCHARGE MEDICATION: Allergies as of 03/11/2024       Reactions   Penicillins Other (See Comments)   Mother told me I was allergic  Mother told me I was allergic   Duloxetine Hcl Other (See Comments)   SLEEPINESS/GROGGY        Medication List     TAKE these medications    acamprosate 333 MG tablet Commonly known as: CAMPRAL Take 666 mg by mouth 3 (three) times daily.   amLODipine  5 MG tablet Commonly known as: NORVASC  Take 1 tablet (5 mg total) by mouth daily.   buprenorphine -naloxone  8-2 mg Subl SL tablet Commonly known as: SUBOXONE  Place 1 tablet under the tongue 3 (three) times daily.   cefadroxil 500 MG capsule Commonly known as: DURICEF Take 2 capsules (1,000 mg total) by mouth 2 (two) times daily for 10 days.   EluRyng 0.12-0.015 MG/24HR vaginal ring Generic drug: etonogestrel-ethinyl estradiol Place 1 each vaginally every 21 ( twenty-one) days.   EPINEPHrine  0.3 mg/0.3 mL Soaj injection Commonly known as: EPI-PEN Inject 0.3 mg into the muscle as needed for anaphylaxis.   Fe Fum-Vit C-Vit B12-FA Caps capsule Commonly known as: TRIGELS-F FORTE Take 1 capsule by mouth daily after breakfast. Start taking on:  March 12, 2024   hydrOXYzine  10 MG tablet Commonly known as: ATARAX  Take 1 tablet (10 mg total) by mouth 3 (three) times daily as needed for itching or anxiety.   multivitamin with minerals Tabs tablet Take 1 tablet by mouth daily. Start taking on: March 12, 2024   nicotine  21 mg/24hr patch Commonly known as: NICODERM CQ  - dosed in mg/24 hours Place 1 patch (21 mg total) onto the skin daily.   omeprazole 20 MG capsule Commonly known as: PRILOSEC Take 20 mg by mouth daily.   Oxycodone  HCl 10 MG Tabs Take 1 tablet (10 mg total) by mouth every 4 (four) hours as needed for moderate pain (pain score 4-6).   thiamine  100 MG tablet Commonly known as: Vitamin B-1 Take 1 tablet (100 mg total) by mouth daily. Start taking on: March 12, 2024               Durable Medical Equipment  (From admission, onward)           Start     Ordered   03/11/24 1219  For home use only DME Bedside commode  Once       Question:  Patient needs a bedside commode to treat with the following condition  Answer:  Right leg injury   03/11/24 1218  03/11/24 1218  For home use only DME Walker rolling  Once       Question Answer Comment  Walker: With 5 Inch Wheels   Patient needs a walker to treat with the following condition Leg injury, right, sequela      03/11/24 1218              Discharge Care Instructions  (From admission, onward)           Start     Ordered   03/11/24 0000  Discharge wound care:       Comments: Cleanse RLE wound with Vashe Soila [phone]), pat dry.  Apply hydrogel Soila [phone]) to wound bed, top with dry dressing. Secure with kerlix   03/11/24 1228            Follow-up Information     [facility]. Schedule an appointment as soon as possible for a visit in 1 week(s).   Specialty: General Practice Contact information: [address] [place] [address] [phone]                Discharge  Exam: Fredricka Weights   03/08/24 2121  Weight: 65.8 kg   General.  Well-developed lady, in no acute distress. Pulmonary.  Lungs clear bilaterally, normal respiratory effort. CV.  Regular rate and rhythm, no JVD, rub or murmur. Abdomen.  Soft, nontender, nondistended, BS positive. CNS.  Alert and oriented .  No focal neurologic deficit. Extremities.  No edema, no cyanosis, pulses intact and symmetrical. Psychiatry.  Judgment and insight appears normal.   Condition at discharge: stable  The results of significant diagnostics from this hospitalization (including imaging, microbiology, ancillary and laboratory) are listed below for reference.   Imaging Studies: ECHOCARDIOGRAM COMPLETE Result Date: 03/10/2024    ECHOCARDIOGRAM REPORT   Patient Name:   Courtney Swanson Date of Exam: 03/10/2024 Medical Rec #:  [id number]        Height:       67.0 in Accession #:    [id number]       Weight:       145.0 lb Date of Birth:  01/28/1996        BSA:          1.764 m Patient Age:    28 years         BP:           123/104 mmHg Patient Gender: F                HR:           97 bpm. Exam Location:  [facility] Procedure: 2D Echo, 3D Echo, Color Doppler and Cardiac Doppler (Both Spectral            and Color Flow Doppler were utilized during procedure). Indications:     Bactermia  History:         Patient has no prior history of Echocardiogram examinations.                  Risk Factors:Hypertension, Current Smoker and Alcohol Abuse.                  GERD.  Sonographer:     Logan Shove RDCS Referring Phys:  [id number] Jasyah Theurer Diagnosing Phys: Annabella Scarce MD  Sonographer Comments: Body Position. IMPRESSIONS  1. Left ventricular ejection fraction, by estimation, is 50 to 55%. Left ventricular ejection fraction by 3D volume is 51 %. The  left ventricle has low normal function. The left ventricle has no regional wall motion abnormalities. Left ventricular diastolic parameters were normal.  2. Right ventricular systolic  function is normal. The right ventricular size is normal.  3. Left atrial size was moderately dilated.  4. The mitral valve is normal in structure. No evidence of mitral valve regurgitation. No evidence of mitral stenosis.  5. The aortic valve is tricuspid. Aortic valve regurgitation is not visualized. No aortic stenosis is present.  6. The inferior vena cava is dilated in size with >50% respiratory variability, suggesting right atrial pressure of 8 mmHg. FINDINGS  Left Ventricle: Left ventricular ejection fraction, by estimation, is 50 to 55%. Left ventricular ejection fraction by 3D volume is 51 %. The left ventricle has low normal function. The left ventricle has no regional wall motion abnormalities. The left ventricular internal cavity size was normal in size. There is no left ventricular hypertrophy. Left ventricular diastolic parameters were normal. Normal left ventricular filling pressure. Right Ventricle: The right ventricular size is normal. No increase in right ventricular wall thickness. Right ventricular systolic function is normal. Left Atrium: Left atrial size was moderately dilated. Right Atrium: Right atrial size was normal in size. Pericardium: There is no evidence of pericardial effusion. Mitral Valve: The mitral valve is normal in structure. No evidence of mitral valve regurgitation. No evidence of mitral valve stenosis. Tricuspid Valve: The tricuspid valve is normal in structure. Tricuspid valve regurgitation is not demonstrated. No evidence of tricuspid stenosis. Aortic Valve: The aortic valve is tricuspid. Aortic valve regurgitation is not visualized. No aortic stenosis is present. Aortic valve peak gradient measures 8.2 mmHg. Pulmonic Valve: The pulmonic valve was normal in structure. Pulmonic valve regurgitation is not visualized. No evidence of pulmonic stenosis. Aorta: The aortic root is normal in size and structure. Venous: The inferior vena cava is dilated in size with greater than 50%  respiratory variability, suggesting right atrial pressure of 8 mmHg. IAS/Shunts: No atrial level shunt detected by color flow Doppler. Additional Comments: 3D was performed not requiring image post processing on an independent workstation and was normal.  LEFT VENTRICLE PLAX 2D LVIDd:         5.30 cm         Diastology LVIDs:         3.90 cm         LV e' medial:    12.00 cm/s LV PW:         0.60 cm         LV E/e' medial:  9.5 LV IVS:        0.80 cm         LV e' lateral:   12.60 cm/s LVOT diam:     2.40 cm         LV E/e' lateral: 9.0 LVOT Area:     4.52 cm                                 3D Volume EF                                LV 3D EF:    Left  ventricul                                             ar                                             ejection                                             fraction                                             by 3D                                             volume is                                             51 %.                                 3D Volume EF:                                3D EF:        51 %                                LV EDV:       137 ml                                LV ESV:       67 ml                                LV SV:        70 ml RIGHT VENTRICLE             IVC RV Basal diam:  3.50 cm     IVC diam: 2.40 cm RV S prime:     17.60 cm/s TAPSE (M-mode): 3.2 cm LEFT ATRIUM             Index        RIGHT ATRIUM           Index LA diam:        3.40 cm 1.93 cm/m   RA Area:     17.20 cm LA Vol (A2C):   84.0 ml 47.62 ml/m  RA Volume:   44.70 ml  25.34 ml/m LA Vol (A4C):   46.3  ml 26.25 ml/m LA Biplane Vol: 62.4 ml 35.38 ml/m  AORTIC VALVE AV Area (Vmax): 2.91 cm AV Vmax:        143.00 cm/s AV Peak Grad:   8.2 mmHg LVOT Vmax:      92.00 cm/s  AORTA Ao Root diam: 3.00 cm Ao Asc diam:  2.60 cm MITRAL VALVE MV Area (PHT): 4.06 cm     SHUNTS MV Decel Time: 187 msec     Systemic Diam: 2.40 cm MV E  velocity: 114.00 cm/s MV A velocity: 63.00 cm/s MV E/A ratio:  1.81 Annabella Scarce MD Electronically signed by Annabella Scarce MD Signature Date/Time: 03/10/2024/1:39:32 PM    Final    US  Venous Img Lower Unilateral Right (DVT) Result Date: 03/09/2024 CLINICAL DATA:  Right lower extremity pain and swelling. EXAM: RIGHT LOWER EXTREMITY VENOUS DOPPLER ULTRASOUND TECHNIQUE: Gray-scale sonography with compression, as well as color and duplex ultrasound, were performed to evaluate the deep venous system(s) from the level of the common femoral vein through the popliteal and proximal calf veins. COMPARISON:  None Available. FINDINGS: VENOUS Normal compressibility of the common femoral, superficial femoral, and popliteal veins, as well as the visualized calf veins. Visualized portions of profunda femoral vein and great saphenous vein unremarkable. No filling defects to suggest DVT on grayscale or color Doppler imaging. Doppler waveforms show normal direction of venous flow, normal respiratory plasticity and response to augmentation. Limited views of the contralateral common femoral vein are unremarkable. OTHER None. Limitations: Patient refused compression maneuvers during the exam which limits the overall validity of the study. However, all vessels are well visualized on color Doppler and there is no convincing evidence of thrombus. IMPRESSION: Negative. Electronically Signed   By: Wilkie Lent M.D.   On: 03/09/2024 08:42   DG Tibia/Fibula Right Result Date: 03/08/2024 CLINICAL DATA:  Nonhealing wound for 1 year, pain and swelling EXAM: DG TIBIA/FIBULA 2V*R* COMPARISON:  None Available. FINDINGS: Frontal and lateral views of the right tibia and fibula are obtained. There are no acute or destructive bony abnormalities. Right knee and ankle are in anatomic alignment. There is diffuse subcutaneous edema. No soft tissue gas or radiopaque foreign body. IMPRESSION: 1. Diffuse subcutaneous edema a paddle with  cellulitis. 2. No acute or destructive bony abnormality. Electronically Signed   By: Ozell Daring M.D.   On: 03/08/2024 23:03    Microbiology: Results for orders placed or performed during the hospital encounter of 03/08/24  Culture, blood (routine x 2)     Status: None (Preliminary result)   Collection Time: 03/08/24  9:38 PM   Specimen: BLOOD  Result Value Ref Range Status   Specimen Description BLOOD RIGHT ANTECUBITAL  Final   Special Requests   Final    BOTTLES DRAWN AEROBIC AND ANAEROBIC Blood Culture results may not be optimal due to an inadequate volume of blood received in culture bottles   Culture   Final    NO GROWTH 3 DAYS Performed at [facility], [address] [place], [place] [address]    Report Status PENDING  Incomplete  Culture, blood (routine x 2)     Status: Abnormal   Collection Time: 03/08/24 10:38 PM   Specimen: BLOOD LEFT ARM  Result Value Ref Range Status   Specimen Description   Final    BLOOD LEFT ARM Performed at [facility], [address] [place], [place] [address]    Special Requests   Final    BOTTLES DRAWN AEROBIC AND ANAEROBIC Blood  Culture adequate volume Performed at [facility], [address] [place], [place] [address]    Culture  Setup Time   Final    GRAM POSITIVE COCCI AEROBIC BOTTLE ONLY CRITICAL RESULT CALLED TO, READ BACK BY AND VERIFIED WITH: EMILY STEINBOCK PHARM.D 03/09/24 1432 KG Performed at [facility], [address], [place], [place] [address]    Culture STREPTOCOCCUS GROUP G (A)  Final   Report Status 03/11/2024 FINAL  Final   Organism ID, Bacteria STREPTOCOCCUS GROUP G  Final      Susceptibility   Streptococcus group g - MIC*    CLINDAMYCIN <=0.25 SENSITIVE Sensitive     AMPICILLIN <=0.25 SENSITIVE Sensitive     ERYTHROMYCIN <=0.12 SENSITIVE Sensitive     VANCOMYCIN 0.5 SENSITIVE Sensitive     CEFTRIAXONE  <=0.12 SENSITIVE Sensitive     LEVOFLOXACIN 1 SENSITIVE Sensitive      PENICILLIN <=0.06 SENSITIVE Sensitive     * STREPTOCOCCUS GROUP G  Blood Culture ID Panel (Reflexed)     Status: Abnormal   Collection Time: 03/08/24 10:38 PM  Result Value Ref Range Status   Enterococcus faecalis NOT DETECTED NOT DETECTED Final   Enterococcus Faecium NOT DETECTED NOT DETECTED Final   Listeria monocytogenes NOT DETECTED NOT DETECTED Final   Staphylococcus species NOT DETECTED NOT DETECTED Final   Staphylococcus aureus (BCID) NOT DETECTED NOT DETECTED Final   Staphylococcus epidermidis NOT DETECTED NOT DETECTED Final   Staphylococcus lugdunensis NOT DETECTED NOT DETECTED Final   Streptococcus species DETECTED (A) NOT DETECTED Final    Comment: Not Enterococcus species, Streptococcus agalactiae, Streptococcus pyogenes, or Streptococcus pneumoniae. CRITICAL RESULT CALLED TO, READ BACK BY AND VERIFIED WITH: EMILY STEINBOCK PHARM.D 03/09/24 1432 KG    Streptococcus agalactiae NOT DETECTED NOT DETECTED Final   Streptococcus pneumoniae NOT DETECTED NOT DETECTED Final   Streptococcus pyogenes NOT DETECTED NOT DETECTED Final   A.calcoaceticus-baumannii NOT DETECTED NOT DETECTED Final   Bacteroides fragilis NOT DETECTED NOT DETECTED Final   Enterobacterales NOT DETECTED NOT DETECTED Final   Enterobacter cloacae complex NOT DETECTED NOT DETECTED Final   Escherichia coli NOT DETECTED NOT DETECTED Final   Klebsiella aerogenes NOT DETECTED NOT DETECTED Final   Klebsiella oxytoca NOT DETECTED NOT DETECTED Final   Klebsiella pneumoniae NOT DETECTED NOT DETECTED Final   Proteus species NOT DETECTED NOT DETECTED Final   Salmonella species NOT DETECTED NOT DETECTED Final   Serratia marcescens NOT DETECTED NOT DETECTED Final   Haemophilus influenzae NOT DETECTED NOT DETECTED Final   Neisseria meningitidis NOT DETECTED NOT DETECTED Final   Pseudomonas aeruginosa NOT DETECTED NOT DETECTED Final   Stenotrophomonas maltophilia NOT DETECTED NOT DETECTED Final   Candida albicans NOT  DETECTED NOT DETECTED Final   Candida auris NOT DETECTED NOT DETECTED Final   Candida glabrata NOT DETECTED NOT DETECTED Final   Candida krusei NOT DETECTED NOT DETECTED Final   Candida parapsilosis NOT DETECTED NOT DETECTED Final   Candida tropicalis NOT DETECTED NOT DETECTED Final   Cryptococcus neoformans/gattii NOT DETECTED NOT DETECTED Final    Comment: Performed at [facility], [address] [place], [place] [address]  Culture, blood (Routine X 2) w Reflex to ID Panel     Status: None (Preliminary result)   Collection Time: 03/10/24  9:50 AM   Specimen: BLOOD  Result Value Ref Range Status   Specimen Description BLOOD BLOOD LEFT ARM  Final   Special Requests   Final    BOTTLES DRAWN AEROBIC AND ANAEROBIC  Blood Culture adequate volume   Culture   Final    NO GROWTH < 24 HOURS Performed at [facility], [address] [place], [place] [address]    Report Status PENDING  Incomplete  Culture, blood (Routine X 2) w Reflex to ID Panel     Status: None (Preliminary result)   Collection Time: 03/10/24 11:41 AM   Specimen: BLOOD  Result Value Ref Range Status   Specimen Description BLOOD BLOOD LEFT ARM  Final   Special Requests   Final    BOTTLES DRAWN AEROBIC AND ANAEROBIC Blood Culture adequate volume   Culture   Final    NO GROWTH < 24 HOURS Performed at [facility], [address] [place], [place] [address]    Report Status PENDING  Incomplete    Labs: CBC: Recent Labs  Lab 03/08/24 2125 03/09/24 0952 03/11/24 0501  WBC 10.0 8.6 8.3  NEUTROABS 7.7  --   --   HGB 9.8* 9.5* 9.0*  HCT 28.4* 28.5* 27.0*  MCV 103.6* 105.6* 107.6*  PLT 229 217 321   Basic Metabolic Panel: Recent Labs  Lab 03/08/24 2125 03/09/24 0952 03/11/24 0501  NA 134* 136 141  K 3.1* 3.2* 3.4*  CL 97* 100 105  CO2 24 24 24   GLUCOSE 109* 108* 107*  BUN <5* 5* <5*  CREATININE 0.47 0.37* <0.30*  CALCIUM  8.6* 8.2* 8.5*  MG 1.6* 2.1  --   PHOS 2.7  --   --     Liver Function Tests: No results for input(s): AST, ALT, ALKPHOS, BILITOT, PROT, ALBUMIN in the last 168 hours. CBG: No results for input(s): GLUCAP in the last 168 hours.  Discharge time spent: greater than 30 minutes.  This record has been created using Conservation officer, historic buildings. Errors have been sought and corrected,but may not always be located. Such creation errors do not reflect on the standard of care.   Signed: Amaryllis Dare, MD [facility] 03/11/2024

## 2024-03-11 NOTE — TOC CM/SW Note (Signed)
 Transition of Care (TOC) CM/SW Note   .Occupational Therapy * Physical Therapy * Speech Therapy  DATE 03/11/24 PATIENT NAME: Courtney Swanson  PATIENT MRN [id number]   DIAGNOSIS/DIAGNOSIS CODE   L03.115   DATE OF DISCHARGE 03/11/24  PRIMARY CARE PHYSICIAN [facility]  PCP PHONE/FAX: [phone]    Dear Provider    I certify that I have examined this patient and that occupational/physical/speech therapy is necessary on an outpatient basis.    The patient has expressed interest in completing their recommended course of therapy at your location.  Once a formal order from the patient's primary care physician has been obtained, please contact him/her to schedule an appointment for evaluation at your earliest convenience.  [ x ]  Physical Therapy Evaluate and Treat  [ x ]  Occupational Therapy Evaluate and Treat  [  ]  Speech Therapy Evaluate and Treat  The patient's primary care physician (listed above) must furnish and be responsible for a formal order such that the recommended services may be furnished while under the primary physician's care, and that the plan of care will be established and reviewed every 30 days (or more often if condition necessitates).

## 2024-03-11 NOTE — Plan of Care (Signed)
   Problem: Clinical Measurements: Goal: Ability to maintain clinical measurements within normal limits will improve Outcome: Progressing

## 2024-03-11 NOTE — TOC CM/SW Note (Addendum)
 Transition of Care (TOC) CM/SW Note   .Patient is not able to walk the distance required to go the bathroom, or he/she is unable to safely negotiate stairs required to access the bathroom.  A 3in1 BSC will alleviate this problem

## 2024-03-11 NOTE — Progress Notes (Signed)
 Physical Therapy Treatment Patient Details Name: Courtney Swanson MRN: [id number] DOB: September 12, 1995 Today's Date: 03/11/2024   History of Present Illness Courtney Swanson is a 28 y.o. female with medical history significant of HTN, substance use disorder, tobacco abuse, alcohol abuse, GERD, metastatic anemia, alcoholic pancreatitis, who presents with right leg wound and pain.    PT Comments  Pt in bed.  She is able to get in/out of bed on her own with good flexion of RLE with mobility.  She squat pivots to [facility] to void where she is able to attend to her own needs with modified independent.  She is encouraged to try gait but is limited to short distances with heavy WB on UE's for about 8 steps.  She self limits due to pain and opts to return to bed.  Pt does have wheelchair at home that she can use.  Will need RW and BSC upon discharge.  While gait is limited, she can transfer in and out of wheelchair and [facility] on her own at this time.  Will adjust discharge recommendations to refect current mobility. Discussed with MD and primary PT.     If plan is discharge home, recommend the following: A little help with walking and/or transfers;Assist for transportation;Assistance with cooking/housework;Help with stairs or ramp for entrance   Can travel by private vehicle        Equipment Recommendations  BSC/3in1;Rollator (4 wheels)    Recommendations for Other Services       Precautions / Restrictions Precautions Precautions: Fall Recall of Precautions/Restrictions: Intact Restrictions Weight Bearing Restrictions Per Provider Order: No     Mobility  Bed Mobility Overal bed mobility: Modified Independent               Patient Response: Cooperative, Anxious  Transfers Overall transfer level: Modified independent Equipment used: Rolling walker (2 wheels), None Transfers: Bed to chair/wheelchair/BSC, Sit to/from Stand Sit to Stand: Contact guard assist     Squat pivot transfers:  Modified independent (Device/Increase time)          Ambulation/Gait Ambulation/Gait assistance: Contact guard assist Gait Distance (Feet): 5 Feet Assistive device: Rolling walker (2 wheels) Gait Pattern/deviations: Step-to pattern Gait velocity: decreased     General Gait Details: limts wB on RLE due to pain   Stairs             Wheelchair Mobility     Tilt Bed Tilt Bed Patient Response: Cooperative, Anxious  Modified Rankin (Stroke Patients Only)       Balance Overall balance assessment: Needs assistance Sitting-balance support: Feet supported Sitting balance-Leahy Scale: Normal     Standing balance support: Bilateral upper extremity supported Standing balance-Leahy Scale: Fair Standing balance comment: heavy reliance on walker for stepping but can let go to adjust clothing after toiletting                            Communication Communication Communication: No apparent difficulties  Cognition Arousal: Alert Behavior During Therapy: WFL for tasks assessed/performed                           PT - Cognition Comments: generally short at times and wanting to do things on her own Following commands: Intact      Cueing Cueing Techniques: Verbal cues  Exercises      General Comments        Pertinent Vitals/Pain Pain Assessment Pain  Assessment: Faces Faces Pain Scale: Hurts whole lot Pain Location: R LE Pain Descriptors / Indicators: Discomfort, Grimacing, Sore Pain Intervention(s): Limited activity within patient's tolerance, Monitored during session, Repositioned, Premedicated before session    Home Living                          Prior Function            PT Goals (current goals can now be found in the care plan section) Progress towards PT goals: Progressing toward goals    Frequency    Min 3X/week      PT Plan      Co-evaluation              AM-PAC PT 6 Clicks Mobility   Outcome  Measure  Help needed turning from your back to your side while in a flat bed without using bedrails?: None Help needed moving from lying on your back to sitting on the side of a flat bed without using bedrails?: None Help needed moving to and from a bed to a chair (including a wheelchair)?: None Help needed standing up from a chair using your arms (e.g., wheelchair or bedside chair)?: None Help needed to walk in hospital room?: A Little Help needed climbing 3-5 steps with a railing? : A Little 6 Click Score: 22    End of Session Equipment Utilized During Treatment: Gait belt Activity Tolerance: Patient limited by pain Patient left: in bed;with call bell/phone within reach Nurse Communication: Mobility status PT Visit Diagnosis: Other abnormalities of gait and mobility (R26.89);Pain;Difficulty in walking, not elsewhere classified (R26.2) Pain - Right/Left: Right Pain - part of body: Leg     Time: 8874-8863 PT Time Calculation (min) (ACUTE ONLY): 11 min  Charges:    $Therapeutic Activity: 8-22 mins PT General Charges $$ ACUTE PT VISIT: 1 Visit                   Lauraine Gills, PTA 03/11/24, 11:57 AM

## 2024-03-13 ENCOUNTER — Ambulatory Visit: Admitting: Physician Assistant

## 2024-03-13 LAB — CULTURE, BLOOD (ROUTINE X 2): Culture: NO GROWTH

## 2024-03-15 LAB — CULTURE, BLOOD (ROUTINE X 2)
Culture: NO GROWTH
Culture: NO GROWTH
Special Requests: ADEQUATE
Special Requests: ADEQUATE

## 2024-03-26 ENCOUNTER — Ambulatory Visit: Admitting: Physician Assistant

## 2024-04-11 ENCOUNTER — Encounter: Admitting: Physician Assistant
